# Patient Record
Sex: Male | Born: 1950 | Race: White | Hispanic: No | Marital: Married | Smoking: Never smoker
Health system: Southern US, Community
[De-identification: ages and names within clinical notes are randomized; demographics above are authoritative.]

## PROBLEM LIST (undated history)

## (undated) DIAGNOSIS — R7303 Prediabetes: Secondary | ICD-10-CM

## (undated) DIAGNOSIS — L989 Disorder of the skin and subcutaneous tissue, unspecified: Secondary | ICD-10-CM

## (undated) DIAGNOSIS — T7840XA Allergy, unspecified, initial encounter: Secondary | ICD-10-CM

## (undated) DIAGNOSIS — E559 Vitamin D deficiency, unspecified: Secondary | ICD-10-CM

## (undated) DIAGNOSIS — M1990 Unspecified osteoarthritis, unspecified site: Secondary | ICD-10-CM

## (undated) DIAGNOSIS — E785 Hyperlipidemia, unspecified: Secondary | ICD-10-CM

## (undated) DIAGNOSIS — G4730 Sleep apnea, unspecified: Secondary | ICD-10-CM

## (undated) DIAGNOSIS — Z87442 Personal history of urinary calculi: Secondary | ICD-10-CM

## (undated) DIAGNOSIS — R0602 Shortness of breath: Secondary | ICD-10-CM

## (undated) DIAGNOSIS — N200 Calculus of kidney: Secondary | ICD-10-CM

## (undated) DIAGNOSIS — R5383 Other fatigue: Secondary | ICD-10-CM

## (undated) DIAGNOSIS — R7989 Other specified abnormal findings of blood chemistry: Secondary | ICD-10-CM

## (undated) DIAGNOSIS — I10 Essential (primary) hypertension: Secondary | ICD-10-CM

## (undated) DIAGNOSIS — R4189 Other symptoms and signs involving cognitive functions and awareness: Secondary | ICD-10-CM

## (undated) DIAGNOSIS — E291 Testicular hypofunction: Secondary | ICD-10-CM

## (undated) DIAGNOSIS — R42 Dizziness and giddiness: Secondary | ICD-10-CM

## (undated) DIAGNOSIS — N400 Enlarged prostate without lower urinary tract symptoms: Secondary | ICD-10-CM

## (undated) HISTORY — DX: Dizziness and giddiness: R42

## (undated) HISTORY — DX: Testicular hypofunction: E29.1

## (undated) HISTORY — PX: APPENDECTOMY: SHX54

## (undated) HISTORY — DX: Shortness of breath: R06.02

## (undated) HISTORY — PX: NECK SURGERY: SHX720

## (undated) HISTORY — DX: Vitamin D deficiency, unspecified: E55.9

## (undated) HISTORY — DX: Essential (primary) hypertension: I10

## (undated) HISTORY — DX: Hyperlipidemia, unspecified: E78.5

## (undated) HISTORY — DX: Disorder of the skin and subcutaneous tissue, unspecified: L98.9

## (undated) HISTORY — PX: LITHOTRIPSY: SUR834

## (undated) HISTORY — PX: COLONOSCOPY: SHX174

## (undated) HISTORY — DX: Other specified abnormal findings of blood chemistry: R79.89

## (undated) HISTORY — DX: Benign prostatic hyperplasia without lower urinary tract symptoms: N40.0

## (undated) HISTORY — DX: Other symptoms and signs involving cognitive functions and awareness: R41.89

## (undated) HISTORY — DX: Unspecified osteoarthritis, unspecified site: M19.90

## (undated) HISTORY — PX: HERNIA REPAIR: SHX51

## (undated) HISTORY — DX: Calculus of kidney: N20.0

## (undated) HISTORY — DX: Other fatigue: R53.83

## (undated) HISTORY — DX: Allergy, unspecified, initial encounter: T78.40XA

## (undated) HISTORY — PX: EYE SURGERY: SHX253

---

## 2007-10-01 ENCOUNTER — Encounter: Admission: RE | Admit: 2007-10-01 | Discharge: 2007-10-01 | Payer: Self-pay | Admitting: Neurosurgery

## 2007-10-17 ENCOUNTER — Ambulatory Visit (HOSPITAL_COMMUNITY): Admission: RE | Admit: 2007-10-17 | Discharge: 2007-10-17 | Payer: Self-pay | Admitting: Neurosurgery

## 2009-03-30 IMAGING — RF DG CERVICAL SPINE 1V
1 series · 1 of 1 positions shown · non-contrast
Comparison: none

CLINICAL DATA: C6-7 ACDF. 
 CERVICAL SPINE ?1 VIEW:

[Series 1: run · 1 of 1 slices shown]
[im 1/1]
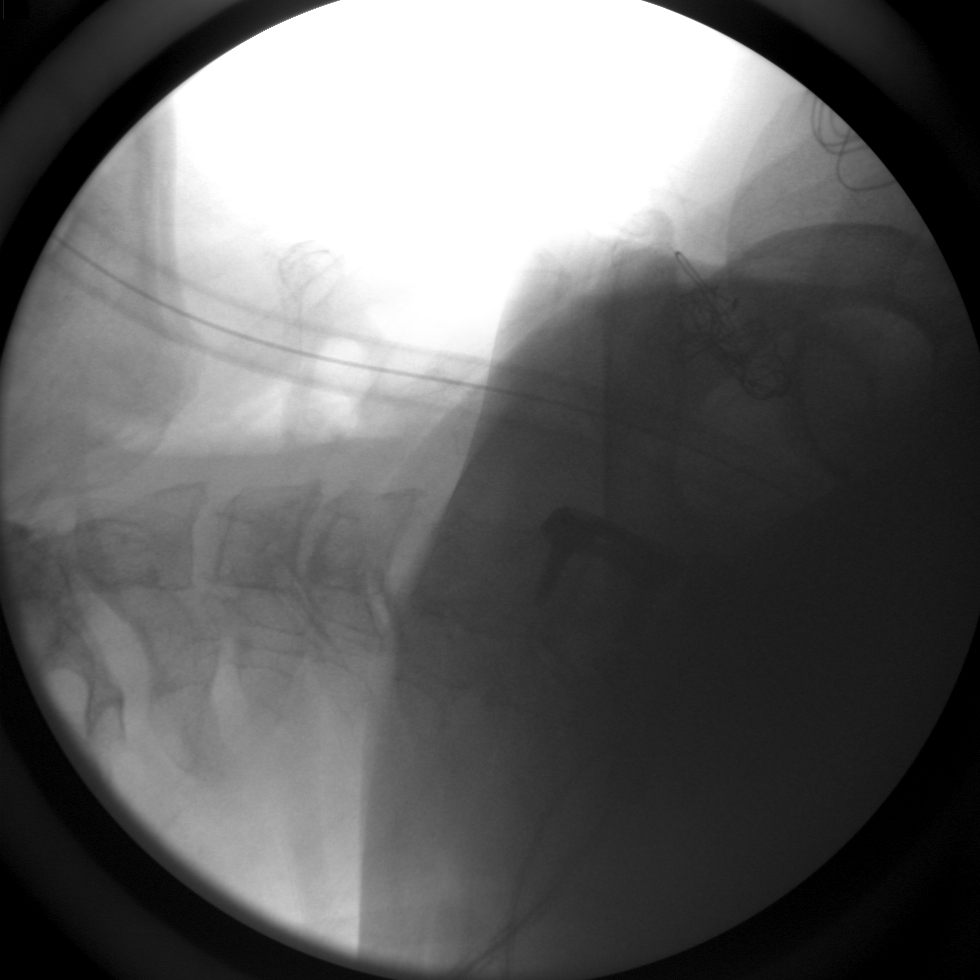

[1 of 1 positions shown; findings below may reference images not displayed]

FINDINGS: A lateral view with the C-arm was obtained.  Based on the prior MRI, there is a prior interbody bony fusion at C5-6.  There is now an anterior plate at C6-7.  Anatomical detail is limited due to overlying shoulders.
IMPRESSION: Anterior plate fusion of C6-7.

## 2010-09-10 ENCOUNTER — Encounter: Payer: Self-pay | Admitting: Neurosurgery

## 2011-01-02 NOTE — Op Note (Signed)
NAME:  [name], [name] NO.:  [id number]   MEDICAL RECORD NO.:  [id number]          PATIENT TYPE:  OIB   LOCATION:  3599                         FACILITY:  [facility]   PHYSICIAN:  Reinaldo Meeker, M.D. DATE OF BIRTH:  1950/12/25   DATE OF PROCEDURE:  10/17/2007  DATE OF DISCHARGE:                               OPERATIVE REPORT   PREOPERATIVE DIAGNOSIS:  Herniated disc C6-C7, diffusely.   POSTOPERATIVE DIAGNOSIS:  Herniated disc C6-C7, diffusely.   PROCEDURE:  C6-C7 anterior cervical discectomy with bone bank fusion  followed by Nuvasive anterior cervical plate.   SURGEON:  Reinaldo Meeker, M.D.   ASSISTANT:  Donalee Citrin, M.D.   PROCEDURE IN DETAIL:  After being placed in the supine position and 5  pounds of halter traction, the patient's left side of the neck was  prepped and draped in the usual sterile fashion.  A localizing x-ray was  taken prior to the incision to identify the appropriate level.  A  transverse incision was made in the left anterior neck starting at the  midline and heading towards the medial aspect of the sternocleidomastoid  muscle.  The platysmal muscle was incised transversely.  The natural  fascial plane between the strap muscles medially and the  sternocleidomastoid muscle laterally, was identified and followed down  to the anterior aspect of the cervical spine.  The longus colli muscles  were identified, split in the midline with unipolar coagulation and  Kitner dissection.  A self-retaining retractor was placed for exposure  and an x-ray showed we approached the appropriate level.  The annulus of  the disc at C6-C7 was incised.  Using pituitaries, rongeurs, and  curettes, the disc material was removed.  A high speed drill was used to  widen the interspace.  The microscope was draped, brought into the  field, and used the remainder of the case.  Using microdissection  technique, the remaining disc material down to the posterior  longitudinal ligament was removed.  The ligament was hypertrophic and  dissected free and then incised transversely.  The cut edge was removed  with a Kerrison punch until the underlying spinal dura and proximal  nerve roots bilaterally were well decompressed.  At this point,  inspection was carried out in all directions for any evidence of  residual compression and none could be identified.  Large amounts of  irrigation were carried out.  Any bleeding was controlled with bipolar  coagulation and Gelfoam.  An 8 mm bony spacer was reconstituted and  impacted without difficulty.  Fluoroscopy showed it to be in good  position.  An appropriate length Helix anterior cervical plate was then  chosen.  Under fluoroscopic guidance, drill holes were placed, followed  by placement of 13 mm screws x 4 until the locking mechanism was  engaged.  Final fluoroscopy showed the plate, plug, and screws to all be  in good position.  Large amounts of irrigation was carried out and any  bleeding controlled with bipolar coagulation.  The wound was then  closed using interrupted Vicryl on the platysma muscle, inverted 5-0 PDS  on the subcuticular layer, and Steri-Strips on the skin.  A sterile  dressing and soft collar were applied and the patient was extubated and  taken to the recovery room in stable condition.           ______________________________  Reinaldo Meeker, M.D.     ROK/MEDQ  D:  10/17/2007  T:  10/18/2007  Job:  [id number]

## 2011-05-11 LAB — BASIC METABOLIC PANEL
BUN: 8
Chloride: 105
GFR calc non Af Amer: >60
Potassium: 4.1
Sodium: 138

## 2011-05-11 LAB — CBC
HCT: 45.1
Hemoglobin: 15.3
MCV: 94.3
Platelets: 194
WBC: 6.4

## 2019-02-24 DIAGNOSIS — Z6827 Body mass index (BMI) 27.0-27.9, adult: Secondary | ICD-10-CM | POA: Insufficient documentation

## 2019-12-17 DIAGNOSIS — M79672 Pain in left foot: Secondary | ICD-10-CM | POA: Insufficient documentation

## 2019-12-17 DIAGNOSIS — M79671 Pain in right foot: Secondary | ICD-10-CM | POA: Insufficient documentation

## 2020-04-14 DIAGNOSIS — M7751 Other enthesopathy of right foot: Secondary | ICD-10-CM

## 2020-04-14 HISTORY — DX: Other enthesopathy of right foot and ankle: M77.51

## 2020-04-15 DIAGNOSIS — M6701 Short Achilles tendon (acquired), right ankle: Secondary | ICD-10-CM | POA: Insufficient documentation

## 2020-04-15 DIAGNOSIS — M6702 Short Achilles tendon (acquired), left ankle: Secondary | ICD-10-CM | POA: Insufficient documentation

## 2020-04-15 HISTORY — DX: Short Achilles tendon (acquired), right ankle: M67.01

## 2020-07-08 DIAGNOSIS — I10 Essential (primary) hypertension: Secondary | ICD-10-CM

## 2020-08-22 DIAGNOSIS — T7840XA Allergy, unspecified, initial encounter: Secondary | ICD-10-CM | POA: Insufficient documentation

## 2020-08-22 DIAGNOSIS — I10 Essential (primary) hypertension: Secondary | ICD-10-CM | POA: Insufficient documentation

## 2020-08-22 DIAGNOSIS — R7989 Other specified abnormal findings of blood chemistry: Secondary | ICD-10-CM

## 2020-08-22 DIAGNOSIS — E559 Vitamin D deficiency, unspecified: Secondary | ICD-10-CM | POA: Insufficient documentation

## 2020-08-23 ENCOUNTER — Encounter: Payer: Self-pay | Admitting: Cardiology

## 2020-08-23 ENCOUNTER — Other Ambulatory Visit: Payer: Self-pay

## 2020-08-23 ENCOUNTER — Ambulatory Visit (INDEPENDENT_AMBULATORY_CARE_PROVIDER_SITE_OTHER): Payer: 59

## 2020-08-23 ENCOUNTER — Ambulatory Visit (INDEPENDENT_AMBULATORY_CARE_PROVIDER_SITE_OTHER): Payer: 59 | Admitting: Cardiology

## 2020-08-23 VITALS — BP 142/86 | HR 75 | Ht 65.0 in | Wt 158.2 lb

## 2020-08-23 DIAGNOSIS — I200 Unstable angina: Secondary | ICD-10-CM

## 2020-08-23 DIAGNOSIS — I8393 Asymptomatic varicose veins of bilateral lower extremities: Secondary | ICD-10-CM

## 2020-08-23 DIAGNOSIS — R079 Chest pain, unspecified: Secondary | ICD-10-CM | POA: Insufficient documentation

## 2020-08-23 DIAGNOSIS — R42 Dizziness and giddiness: Secondary | ICD-10-CM

## 2020-08-23 DIAGNOSIS — I10 Essential (primary) hypertension: Secondary | ICD-10-CM | POA: Diagnosis not present

## 2020-08-23 HISTORY — DX: Asymptomatic varicose veins of bilateral lower extremities: I83.93

## 2020-08-23 HISTORY — DX: Unstable angina: I20.0

## 2020-08-23 HISTORY — DX: Chest pain, unspecified: R07.9

## 2020-08-23 LAB — LIPID PANEL
Chol/HDL Ratio: 5.8 ratio — ABNORMAL HIGH (ref 0.0–5.0)
Cholesterol, Total: 198 mg/dL (ref 100–199)
HDL: 34 mg/dL — ABNORMAL LOW (ref >39)
LDL Chol Calc (NIH): 130 mg/dL — ABNORMAL HIGH (ref 0–99)
Triglycerides: 192 mg/dL — ABNORMAL HIGH (ref 0–149)
VLDL Cholesterol Cal: 34 mg/dL (ref 5–40)

## 2020-08-23 LAB — BASIC METABOLIC PANEL
BUN/Creatinine Ratio: 14 (ref 10–24)
BUN: 13 mg/dL (ref 8–27)
CO2: 22 mmol/L (ref 20–29)
Calcium: 9.2 mg/dL (ref 8.6–10.2)
Chloride: 102 mmol/L (ref 96–106)
Creatinine, Ser: 0.95 mg/dL (ref 0.76–1.27)
GFR calc Af Amer: 95 mL/min/{1.73_m2} (ref >59)
GFR calc non Af Amer: 82 mL/min/{1.73_m2} (ref >59)
Glucose: 90 mg/dL (ref 65–99)
Potassium: 4.2 mmol/L (ref 3.5–5.2)
Sodium: 142 mmol/L (ref 134–144)

## 2020-08-23 LAB — TSH: TSH: 3.2 u[IU]/mL (ref 0.450–4.500)

## 2020-08-23 LAB — MAGNESIUM: Magnesium: 1.9 mg/dL (ref 1.6–2.3)

## 2020-08-23 NOTE — Progress Notes (Signed)
Cardiology Office Note:    Date:  08/23/2020   ID:  Dustin Sloan, DOB 1950-09-21, MRN [id number]  PCP:  Imagene Riches, NP  Cardiologist:  Berniece Salines, DO  Electrophysiologist:  None   Referring MD: Imagene Riches, NP    History of Present Illness:    Dustin Sloan is a 70 y.o. male with a hx of hypertension, vitamin D deficiency is here today to be evaluated for dizziness and chest pain.  Patient tells me over the last several years he has had increasing dizziness.  He notes that he never really complains about it he kept it to himself.  He notes he also has had intermittent chest pain.  He described as a chest discomfort left-sided with no radiation.  Nothing makes it better or worse.  He denies any shortness of breath.  But tells me the main problem he is experiencing some dizziness and his intermittent chest pain.  In addition he admits to some leg cramps.  His PCP has previously sent the patient to [facility] hospital to get an echocardiogram which was essentially normal.  Past Medical History:  Diagnosis Date  . Allergies   . Hypertension   . Low testosterone   . Vitamin D deficiency     Past Surgical History:  Procedure Laterality Date  . APPENDECTOMY     2018  . NECK SURGERY     1988, 2008    Current Medications: Current Meds  Medication Sig  . Cholecalciferol 50 MCG (2000 UT) CAPS Take by mouth.  . losartan (COZAAR) 50 MG tablet Take 50 mg by mouth daily.  . meclizine (ANTIVERT) 25 MG tablet Take 25 mg by mouth 3 (three) times daily as needed for dizziness.  . meloxicam (MOBIC) 7.5 MG tablet Take 7.5 mg by mouth daily.  . minocycline (MINOCIN) 100 MG capsule Take 100 mg by mouth daily as needed.  . tadalafil (CIALIS) 5 MG tablet Take 5 mg by mouth daily.  Marland Kitchen triamcinolone (KENALOG) 0.1 % Apply 1 application topically 3 (three) times daily.  . vitamin B-12 (CYANOCOBALAMIN) 500 MCG tablet Take by mouth.     Allergies:   Oxycodone-acetaminophen   Social History    Socioeconomic History  . Marital status: Married    Spouse name: Not on file  . Number of children: Not on file  . Years of education: Not on file  . Highest education level: Not on file  Occupational History  . Not on file  Tobacco Use  . Smoking status: Never Smoker  . Smokeless tobacco: Never Used  Substance and Sexual Activity  . Alcohol use: Never  . Drug use: Never  . Sexual activity: Not on file  Other Topics Concern  . Not on file  Social History Narrative  . Not on file   Social Determinants of Health   Financial Resource Strain: Not on file  Food Insecurity: Not on file  Transportation Needs: Not on file  Physical Activity: Not on file  Stress: Not on file  Social Connections: Not on file     Family History: The patient's family history includes Diabetes in his mother; Kidney Stones in his mother; Kidney disease in his mother.  ROS:   Review of Systems  Constitution: Reports dizziness.  Negative for decreased appetite, fever and weight gain.  HENT: Negative for congestion, ear discharge, hoarse voice and sore throat.   Eyes: Negative for discharge, redness, vision loss in right eye and visual halos.  Cardiovascular: Reports chest  pain..  Negative for negative for dyspnea on exertion, leg swelling, orthopnea and palpitations.  Respiratory: Negative for cough, hemoptysis, shortness of breath and snoring.   Endocrine: Negative for heat intolerance and polyphagia.  Hematologic/Lymphatic: Negative for bleeding problem. Does not bruise/bleed easily.  Skin: Varicose veins noted in bilateral leg.  Negative for flushing, nail changes, rash and suspicious lesions.  Musculoskeletal: Negative for arthritis, joint pain, muscle cramps, myalgias, neck pain and stiffness.  Gastrointestinal: Negative for abdominal pain, bowel incontinence, diarrhea and excessive appetite.  Genitourinary: Negative for decreased libido, genital sores and incomplete emptying.  Neurological:  Negative for brief paralysis, focal weakness, headaches and loss of balance.  Psychiatric/Behavioral: Negative for altered mental status, depression and suicidal ideas.  Allergic/Immunologic: Negative for HIV exposure and persistent infections.    EKGs/Labs/Other Studies Reviewed:    The following studies were reviewed today:   EKG:  The ekg ordered today demonstrates sinus rhythm, heart rate 72 bpm with first-degree AV block no prior EKG for comparison.  Echocardiogram done at [facility] in November 2021 shows normal EF 60 to 65%.  Grade 1 diastolic dysfunction and mild aortic valve sclerosis without stenosis.  Lower extremity ultrasound on July 07, 2020 shows bilateral no evidence of DVT.  CT scan of the head done on August 09, 2020 showed no acute pathology however small vessels white matter disease.   Recent Labs: No results found for requested labs within last 8760 hours.  Recent Lipid Panel No results found for: CHOL, TRIG, HDL, CHOLHDL, VLDL, LDLCALC, LDLDIRECT  Physical Exam:    VS:  BP (!) 142/86 (BP Location: Right Arm)   Pulse 75   Ht 5' 5"  (1.651 m)   Wt 158 lb 3.2 oz (71.8 kg)   SpO2 96%   BMI 26.33 kg/m     Wt Readings from Last 3 Encounters:  08/23/20 158 lb 3.2 oz (71.8 kg)     GEN: Well nourished, well developed in no acute distress HEENT: Normal NECK: No JVD; No carotid bruits LYMPHATICS: No lymphadenopathy CARDIAC: S1S2 noted,RRR, no murmurs, rubs, gallops RESPIRATORY:  Clear to auscultation without rales, wheezing or rhonchi  ABDOMEN: Soft, non-tender, non-distended, +bowel sounds, no guarding. EXTREMITIES: No edema, No cyanosis, no clubbing MUSCULOSKELETAL:  No deformity  SKIN: Warm and dry NEUROLOGIC:  Alert and oriented x 3, non-focal PSYCHIATRIC:  Normal affect, good insight  ASSESSMENT:    1. Chest pain, unspecified type   2. Dizziness   3. Primary hypertension   4. Unstable angina pectoris ([place])   5. Asymptomatic varicose  veins of both lower extremities    PLAN:      I would like to rule out a cardiovascular etiology of this palpitation, therefore at this time I would like to placed a zio patch for 14 days.  I reviewed his echocardiogram from [facility] which was done in November 2021 does not show any evidence of structural abnormalities.  In terms of his chest pain we discussed pursuing with an ischemic evaluation.  Shared decision a pharmacological nuclear stress test will be done at this time.  The patient educate about this test that he is agreeable to proceed.  All of his questions has been answered.  He does take daily Cialis therefore we will hold off on starting the patient on nitroglycerin. For now I do reccomend that the patient goes to the nearest ED if  symptoms recur.  His blood pressure slightly elevated in the office today.  We discussed cutting back on his  salt and hopefully this will help with some improvement.  If not at his next appointment we will have to consider adjusting his antihypertensive medication.  Varicose veins-he has some questions about intervention for this.  We discussed his options for now he prefers to hold off for any further interventions. The patient is in agreement with the above plan. The patient left the office in stable condition.  The patient will follow up in   Medication Adjustments/Labs and Tests Ordered: Current medicines are reviewed at length with the patient today.  Concerns regarding medicines are outlined above.  Orders Placed This Encounter  Procedures  . Magnesium  . TSH  . Lipid panel  . Basic metabolic panel  . Cardiac Stress Test: Informed Consent Details: Physician/Practitioner Attestation; Transcribe to consent form and obtain patient signature  . Cardiac Stress Test: Informed Consent Details: Physician/Practitioner Attestation; Transcribe to consent form and obtain patient signature  . MYOCARDIAL PERFUSION IMAGING  . LONG TERM MONITOR  (3-14 DAYS)  . EKG 12-Lead   No orders of the defined types were placed in this encounter.   Patient Instructions  Medication Instructions:  Your physician recommends that you continue on your current medications as directed. Please refer to the Current Medication list given to you today.  *If you need a refill on your cardiac medications before your next appointment, please call your pharmacy*   Lab Work: TODAY:  LIPID, TSH, MAG, & BMET  If you have labs (blood work) drawn today and your tests are completely normal, you will receive your results only by: Marland Kitchen MyChart Message (if you have MyChart) OR . A paper copy in the mail If you have any lab test that is abnormal or we need to change your treatment, we will call you to review the results.   Testing/Procedures: Your physician has requested that you have a lexiscan myoview. For further information please visit [email or web address]. Please follow instruction sheet, as BELOW    You are scheduled for a Myocardial Perfusion Imaging Study.  Please arrive 15 minutes prior to your appointment time for registration and insurance purposes.  The test will take approximately 3 to 4 hours to complete; you may bring reading material.  If someone comes with you to your appointment, they will need to remain in the main lobby due to limited space in the testing area. **If you are pregnant or breastfeeding, please notify the nuclear lab prior to your appointment**  How to prepare for your Myocardial Perfusion Test: . Do not eat or drink 3 hours prior to your test, except you may have water. . Do not consume products containing caffeine (regular or decaffeinated) 12 hours prior to your test. (ex: coffee, chocolate, sodas, tea). . Do bring a list of your current medications with you.  If not listed below, you may take your medications as normal. . Do wear comfortable clothes (no dresses or overalls) and walking shoes, tennis shoes preferred (No heels  or open toe shoes are allowed). . Do NOT wear cologne, perfume, aftershave, or lotions (deodorant is allowed). . If these instructions are not followed, your test will have to be rescheduled.   Marland Kitchen   ZIO AT Long term monitor-Live Telemetry  Your physician has requested you wear a ZIO patch monitor for __ days.  This is a single patch monitor. [employer] supplies one patch monitor per enrollment. Additional stickers are not available.  Please do not apply patch if you will be having a Nuclear Stress Test, Echocardiogram, Cardiac CT,  MRI, or Chest Xray during the time frame you would be wearing the monitor. The patch cannot be worn during these tests. You cannot remove and re-apply the ZIO AT patch monitor.   Your ZIO patch monitor will be sent Fed Ex from [employer] directly to your home address. The monitor may also be mailed to a PO BOX if home delivery is not available. It may take 3-5 days to receive your monitor after you have been enrolled.  Once you have received you monitor, please review enclosed instructions. Your monitor has already been registered assigning a specific monitor serial # to you.   Applying the monitor  Shave hair from upper left chest.  Hold abrader disc by orange tab. Rub abrader in 40 strokes over left upper chest as indicated in your monitor instructions.  Clean area with 4 enclosed alcohol pads. Use all pads to ensure the area is cleaned thoroughly. Let dry.  Apply patch as indicated in monitor instructions. Patch will be placed under collarbone on left side of chest with arrow pointing upward.  Rub patch adhesive wings for 2 minutes. Remove the white label marked "1". Remove the white label marked "2". Rub patch adhesive wings for 2 additional minutes.  While looking in a mirror, press and release button in center of patch. A small green light will flash 3-4 times. This will be your only indicator the monitor has been turned on.  Do not shower for the first  24 hours. You may shower after the first 24 hours.  Press the button if you feel a symptom. You will hear a small click. Record Date, Time and Symptom in the Patient Log.   Starting the Gateway  In your kit there is a Hydrographic surveyor box the size of a cellphone. This is Airline pilot. It transmits all your recorded data to [facility]. This box must stay within 10 feet of you at all times. Open the box and push the * button. There will be a light that blinks orange and then green a few times. When the light stops blinking, the Gateway is connected to the ZIO patch.  Call [employer] at [phone] to confirm your monitor is transmitting.   Returning your monitor  Remove your patch and place it inside the [place]. In the lower half of the Gateway there is a white bag with prepaid postage on it. Place Gateway in bag and seal. Mail package back to [place] as soon as possible. Your physician should have your final report approximately 7 days after you have mailed back your monitor.   Call [place] at [phone] if you have questions regarding your ZIO AT patch monitor. Call them immediately if you see an orange light blinking on your monitor.  If your monitor falls off in less than 4 days contact our Monitor department at [phone]. If your monitor becomes loose or falls off after 4 days call [employer] at [phone] for suggestions on securing your monitor.      Follow-Up: At [facility], you and your health needs are our priority.  As part of our continuing mission to provide you with exceptional heart care, we have created designated Provider Care Teams.  These Care Teams include your primary Cardiologist (physician) and Advanced Practice Providers (APPs -  Physician Assistants and Nurse Practitioners) who all work together to provide you with the care you need, when you need it.  We recommend signing up for the patient portal called "MyChart".  Sign up  information is  provided on this After Visit Summary.  MyChart is used to connect with patients for Virtual Visits (Telemedicine).  Patients are able to view lab/test results, encounter notes, upcoming appointments, etc.  Non-urgent messages can be sent to your provider as well.   To learn more about what you can do with MyChart, go to [email or web address].    Your next appointment:   3 month(s)  The format for your next appointment:   In Person  Provider:   Berniece Salines, DO   Other Instructions  Cardiac Nuclear Scan A cardiac nuclear scan is a test that is done to check the flow of blood to your heart. It is done when you are resting and when you are exercising. The test looks for problems such as:  Not enough blood reaching a portion of the heart.  The heart muscle not working as it should. You may need this test if:  You have heart disease.  You have had lab results that are not normal.  You have had heart surgery or a balloon procedure to open up blocked arteries (angioplasty).  You have chest pain.  You have shortness of breath. In this test, a special dye (tracer) is put into your bloodstream. The tracer will travel to your heart. A camera will then take pictures of your heart to see how the tracer moves through your heart. This test is usually done at a hospital and takes 2-4 hours. Tell a doctor about:  Any allergies you have.  All medicines you are taking, including vitamins, herbs, eye drops, creams, and over-the-counter medicines.  Any problems you or family members have had with anesthetic medicines.  Any blood disorders you have.  Any surgeries you have had.  Any medical conditions you have.  Whether you are pregnant or may be pregnant. What are the risks? Generally, this is a safe test. However, problems may occur, such as:  Serious chest pain and heart attack. This is only a risk if the stress portion of the test is done.  Rapid heartbeat.  A feeling of warmth  in your chest. This feeling usually does not last long.  Allergic reaction to the tracer. What happens before the test?  Ask your doctor about changing or stopping your normal medicines. This is important.  Follow instructions from your doctor about what you cannot eat or drink.  Remove your jewelry on the day of the test. What happens during the test?  An IV tube will be inserted into one of your veins.  Your doctor will give you a small amount of tracer through the IV tube.  You will wait for 20-40 minutes while the tracer moves through your bloodstream.  Your heart will be monitored with an electrocardiogram (ECG).  You will lie down on an exam table.  Pictures of your heart will be taken for about 15-20 minutes.  You may also have a stress test. For this test, one of these things may be done: ? You will be asked to exercise on a treadmill or a stationary bike. ? You will be given medicines that will make your heart work harder. This is done if you are unable to exercise.  When blood flow to your heart has peaked, a tracer will again be given through the IV tube.  After 20-40 minutes, you will get back on the exam table. More pictures will be taken of your heart.  Depending on the tracer that is used, more pictures may need to  be taken 3-4 hours later.  Your IV tube will be removed when the test is over. The test may vary among doctors and hospitals. What happens after the test?  Ask your doctor: ? Whether you can return to your normal schedule, including diet, activities, and medicines. ? Whether you should drink more fluids. This will help to remove the tracer from your body. Drink enough fluid to keep your pee (urine) pale yellow.  Ask your doctor, or the department that is doing the test: ? When will my results be ready? ? How will I get my results? Summary  A cardiac nuclear scan is a test that is done to check the flow of blood to your heart.  Tell your  doctor whether you are pregnant or may be pregnant.  Before the test, ask your doctor about changing or stopping your normal medicines. This is important.  Ask your doctor whether you can return to your normal activities. You may be asked to drink more fluids. This information is not intended to replace advice given to you by your health care provider. Make sure you discuss any questions you have with your health care provider. Document Revised: 11/26/2018 Document Reviewed: 01/20/2018 Elsevier Patient Education  [place].      Adopting a Healthy Lifestyle.  Know what a healthy weight is for you (roughly BMI <25) and aim to maintain this   Aim for 7+ servings of fruits and vegetables daily   65-80+ fluid ounces of water or unsweet tea for healthy kidneys   Limit to max 1 drink of alcohol per day; avoid smoking/tobacco   Limit animal fats in diet for cholesterol and heart health - choose grass fed whenever available   Avoid highly processed foods, and foods high in saturated/trans fats   Aim for low stress - take time to unwind and care for your mental health   Aim for 150 min of moderate intensity exercise weekly for heart health, and weights twice weekly for bone health   Aim for 7-9 hours of sleep daily   When it comes to diets, agreement about the perfect plan isnt easy to find, even among the experts. Experts at the [employer] developed an idea known as the Healthy Eating Plate. Just imagine a plate divided into logical, healthy portions.   The emphasis is on diet quality:   Load up on vegetables and fruits - one-half of your plate: Aim for color and variety, and remember that potatoes dont count.   Go for whole grains - one-quarter of your plate: Whole wheat, barley, wheat berries, quinoa, oats, brown rice, and foods made with them. If you want pasta, go with whole wheat pasta.   Protein power - one-quarter of your plate: Fish, chicken,  beans, and nuts are all healthy, versatile protein sources. Limit red meat.   The diet, however, does go beyond the plate, offering a few other suggestions.   Use healthy plant oils, such as olive, canola, soy, corn, sunflower and peanut. Check the labels, and avoid partially hydrogenated oil, which have unhealthy trans fats.   If youre thirsty, drink water. Coffee and tea are good in moderation, but skip sugary drinks and limit milk and dairy products to one or two daily servings.   The type of carbohydrate in the diet is more important than the amount. Some sources of carbohydrates, such as vegetables, fruits, whole grains, and beans-are healthier than others.   Finally, stay active  Signed,  Berniece Salines, DO  08/23/2020 10:36 AM    [facility]

## 2020-08-23 NOTE — Patient Instructions (Addendum)
Medication Instructions:  Your physician recommends that you continue on your current medications as directed. Please refer to the Current Medication list given to you today.  *If you need a refill on your cardiac medications before your next appointment, please call your pharmacy*   Lab Work: TODAY:  LIPID, TSH, MAG, & BMET  If you have labs (blood work) drawn today and your tests are completely normal, you will receive your results only by: Marland Kitchen MyChart Message (if you have MyChart) OR . A paper copy in the mail If you have any lab test that is abnormal or we need to change your treatment, we will call you to review the results.   Testing/Procedures: Your physician has requested that you have a lexiscan myoview. For further information please visit [email or web address]. Please follow instruction sheet, as BELOW    You are scheduled for a Myocardial Perfusion Imaging Study.  Please arrive 15 minutes prior to your appointment time for registration and insurance purposes.  The test will take approximately 3 to 4 hours to complete; you may bring reading material.  If someone comes with you to your appointment, they will need to remain in the main lobby due to limited space in the testing area. **If you are pregnant or breastfeeding, please notify the nuclear lab prior to your appointment**  How to prepare for your Myocardial Perfusion Test: . Do not eat or drink 3 hours prior to your test, except you may have water. . Do not consume products containing caffeine (regular or decaffeinated) 12 hours prior to your test. (ex: coffee, chocolate, sodas, tea). . Do bring a list of your current medications with you.  If not listed below, you may take your medications as normal. . Do wear comfortable clothes (no dresses or overalls) and walking shoes, tennis shoes preferred (No heels or open toe shoes are allowed). . Do NOT wear cologne, perfume, aftershave, or lotions (deodorant is allowed). . If these  instructions are not followed, your test will have to be rescheduled.   Marland Kitchen   ZIO AT Long term monitor-Live Telemetry  Your physician has requested you wear a ZIO patch monitor for __ days.  This is a single patch monitor. [employer] supplies one patch monitor per enrollment. Additional stickers are not available.  Please do not apply patch if you will be having a Nuclear Stress Test, Echocardiogram, Cardiac CT, MRI, or Chest Xray during the time frame you would be wearing the monitor. The patch cannot be worn during these tests. You cannot remove and re-apply the ZIO AT patch monitor.   Your ZIO patch monitor will be sent Fed Ex from [employer] directly to your home address. The monitor may also be mailed to a PO BOX if home delivery is not available. It may take 3-5 days to receive your monitor after you have been enrolled.  Once you have received you monitor, please review enclosed instructions. Your monitor has already been registered assigning a specific monitor serial # to you.   Applying the monitor  Shave hair from upper left chest.  Hold abrader disc by orange tab. Rub abrader in 40 strokes over left upper chest as indicated in your monitor instructions.  Clean area with 4 enclosed alcohol pads. Use all pads to ensure the area is cleaned thoroughly. Let dry.  Apply patch as indicated in monitor instructions. Patch will be placed under collarbone on left side of chest with arrow pointing upward.  Rub patch adhesive wings for 2 minutes. Remove  the white label marked "1". Remove the white label marked "2". Rub patch adhesive wings for 2 additional minutes.  While looking in a mirror, press and release button in center of patch. A small green light will flash 3-4 times. This will be your only indicator the monitor has been turned on.  Do not shower for the first 24 hours. You may shower after the first 24 hours.  Press the button if you feel a symptom. You will hear a small click.  Record Date, Time and Symptom in the Patient Log.   Starting the Gateway  In your kit there is a Hydrographic surveyor box the size of a cellphone. This is Airline pilot. It transmits all your recorded data to [facility]. This box must stay within 10 feet of you at all times. Open the box and push the * button. There will be a light that blinks orange and then green a few times. When the light stops blinking, the Gateway is connected to the ZIO patch.  Call [employer] at [phone] to confirm your monitor is transmitting.   Returning your monitor  Remove your patch and place it inside the [employer]. In the lower half of the Gateway there is a white bag with prepaid postage on it. Place Gateway in bag and seal. Mail package back to [place] as soon as possible. Your physician should have your final report approximately 7 days after you have mailed back your monitor.   Call [place] at [phone] if you have questions regarding your ZIO AT patch monitor. Call them immediately if you see an orange light blinking on your monitor.  If your monitor falls off in less than 4 days contact our Monitor department at [phone]. If your monitor becomes loose or falls off after 4 days call [employer] at [phone] for suggestions on securing your monitor.      Follow-Up: At [facility], you and your health needs are our priority.  As part of our continuing mission to provide you with exceptional heart care, we have created designated Provider Care Teams.  These Care Teams include your primary Cardiologist (physician) and Advanced Practice Providers (APPs -  Physician Assistants and Nurse Practitioners) who all work together to provide you with the care you need, when you need it.  We recommend signing up for the patient portal called "MyChart".  Sign up information is provided on this After Visit Summary.  MyChart is used to connect with patients for Virtual Visits (Telemedicine).  Patients  are able to view lab/test results, encounter notes, upcoming appointments, etc.  Non-urgent messages can be sent to your provider as well.   To learn more about what you can do with MyChart, go to [email or web address].    Your next appointment:   3 month(s)  The format for your next appointment:   In Person  Provider:   Berniece Salines, DO   Other Instructions  Cardiac Nuclear Scan A cardiac nuclear scan is a test that is done to check the flow of blood to your heart. It is done when you are resting and when you are exercising. The test looks for problems such as:  Not enough blood reaching a portion of the heart.  The heart muscle not working as it should. You may need this test if:  You have heart disease.  You have had lab results that are not normal.  You have had heart surgery or a balloon procedure to open up blocked arteries (angioplasty).  You  have chest pain.  You have shortness of breath. In this test, a special dye (tracer) is put into your bloodstream. The tracer will travel to your heart. A camera will then take pictures of your heart to see how the tracer moves through your heart. This test is usually done at a hospital and takes 2-4 hours. Tell a doctor about:  Any allergies you have.  All medicines you are taking, including vitamins, herbs, eye drops, creams, and over-the-counter medicines.  Any problems you or family members have had with anesthetic medicines.  Any blood disorders you have.  Any surgeries you have had.  Any medical conditions you have.  Whether you are pregnant or may be pregnant. What are the risks? Generally, this is a safe test. However, problems may occur, such as:  Serious chest pain and heart attack. This is only a risk if the stress portion of the test is done.  Rapid heartbeat.  A feeling of warmth in your chest. This feeling usually does not last long.  Allergic reaction to the tracer. What happens before the  test?  Ask your doctor about changing or stopping your normal medicines. This is important.  Follow instructions from your doctor about what you cannot eat or drink.  Remove your jewelry on the day of the test. What happens during the test?  An IV tube will be inserted into one of your veins.  Your doctor will give you a small amount of tracer through the IV tube.  You will wait for 20-40 minutes while the tracer moves through your bloodstream.  Your heart will be monitored with an electrocardiogram (ECG).  You will lie down on an exam table.  Pictures of your heart will be taken for about 15-20 minutes.  You may also have a stress test. For this test, one of these things may be done: ? You will be asked to exercise on a treadmill or a stationary bike. ? You will be given medicines that will make your heart work harder. This is done if you are unable to exercise.  When blood flow to your heart has peaked, a tracer will again be given through the IV tube.  After 20-40 minutes, you will get back on the exam table. More pictures will be taken of your heart.  Depending on the tracer that is used, more pictures may need to be taken 3-4 hours later.  Your IV tube will be removed when the test is over. The test may vary among doctors and hospitals. What happens after the test?  Ask your doctor: ? Whether you can return to your normal schedule, including diet, activities, and medicines. ? Whether you should drink more fluids. This will help to remove the tracer from your body. Drink enough fluid to keep your pee (urine) pale yellow.  Ask your doctor, or the department that is doing the test: ? When will my results be ready? ? How will I get my results? Summary  A cardiac nuclear scan is a test that is done to check the flow of blood to your heart.  Tell your doctor whether you are pregnant or may be pregnant.  Before the test, ask your doctor about changing or stopping your  normal medicines. This is important.  Ask your doctor whether you can return to your normal activities. You may be asked to drink more fluids. This information is not intended to replace advice given to you by your health care provider. Make sure you discuss  any questions you have with your health care provider. Document Revised: 11/26/2018 Document Reviewed: 01/20/2018 Elsevier Patient Education  [place].

## 2020-08-24 ENCOUNTER — Telehealth: Payer: Self-pay | Admitting: *Deleted

## 2020-08-24 NOTE — Telephone Encounter (Signed)
Patient returning call.

## 2020-08-24 NOTE — Telephone Encounter (Signed)
Left message on voicemail in reference to upcoming appointment scheduled for 08/31/20. Phone number given for a call back so details instructions can be given. No mychart available.Lennan Malone, [name]

## 2020-08-24 NOTE — Telephone Encounter (Signed)
Patient given detailed instructions per Myocardial Perfusion Study Information Sheet for the test on 08/31/20 at 0815. Patient notified to arrive 15 minutes early and that it is imperative to arrive on time for appointment to keep from having the test rescheduled.  If you need to cancel or reschedule your appointment, please call the office within 24 hours of your appointment. . Patient verbalized understanding.Dustin Sloan, [name]'

## 2020-08-25 ENCOUNTER — Telehealth: Payer: Self-pay

## 2020-08-25 MED ORDER — ATORVASTATIN CALCIUM 20 MG PO TABS: 20.0000 mg | ORAL_TABLET | Freq: Every day | ORAL | 3 refills | Status: DC

## 2020-08-25 NOTE — Telephone Encounter (Signed)
-----   Message from Thomasene Ripple, DO sent at 08/23/2020  5:03 PM EST ----- His LDL is 130 and his TG is 192, HDL is 32. I would like him to start lipitor 20 mg daily.

## 2020-08-25 NOTE — Telephone Encounter (Signed)
Spoke with patient regarding results and recommendation.  Patient verbalizes understanding and is agreeable to plan of care. Advised patient to call back with any issues or concerns.  

## 2020-08-25 NOTE — Telephone Encounter (Signed)
-----   Message from Kardie Tobb, DO sent at 08/23/2020  5:03 PM EST ----- His LDL is 130 and his TG is 192, HDL is 32. I would like Dustin Sloan to start lipitor 20 mg daily.  

## 2020-08-25 NOTE — Telephone Encounter (Signed)
Left message on patients voicemail to please return our call.   

## 2020-08-25 NOTE — Telephone Encounter (Signed)
Patient called back returning Morgan's Call

## 2020-09-01 ENCOUNTER — Encounter (HOSPITAL_COMMUNITY): Payer: Self-pay | Admitting: *Deleted

## 2020-09-01 ENCOUNTER — Telehealth (HOSPITAL_COMMUNITY): Payer: Self-pay | Admitting: *Deleted

## 2020-09-01 NOTE — Telephone Encounter (Signed)
Patient given detailed instructions per Myocardial Perfusion Study Information Sheet for the test on 09/08/20 at 8:00. Patient notified to arrive 15 minutes early and that it is imperative to arrive on time for appointment to keep from having the test rescheduled.  If you need to cancel or reschedule your appointment, please call the office within 24 hours of your appointment. . Patient verbalized understanding.Daneil Dolin

## 2020-09-01 NOTE — Telephone Encounter (Signed)
Left message on voicemail in reference to upcoming appointment scheduled for 09/08/20. Phone number given for a call back so details instructions can be given. Dustin Sloan

## 2020-09-06 DIAGNOSIS — R42 Dizziness and giddiness: Secondary | ICD-10-CM | POA: Diagnosis not present

## 2020-09-08 ENCOUNTER — Ambulatory Visit (INDEPENDENT_AMBULATORY_CARE_PROVIDER_SITE_OTHER): Payer: 59

## 2020-09-08 ENCOUNTER — Other Ambulatory Visit: Payer: Self-pay

## 2020-09-08 DIAGNOSIS — R079 Chest pain, unspecified: Secondary | ICD-10-CM

## 2020-09-08 LAB — MYOCARDIAL PERFUSION IMAGING
LV dias vol: 97 mL (ref 62–150)
LV sys vol: 39 mL
Peak HR: 88 {beats}/min
Rest HR: 67 {beats}/min
SDS: 1
SRS: 2
SSS: 3
TID: 1.01

## 2020-09-08 MED ORDER — TECHNETIUM TC 99M TETROFOSMIN IV KIT
10.2000 | PACK | Freq: Once | INTRAVENOUS | Status: AC | PRN
  Administered 2020-09-08: 10.2 via INTRAVENOUS

## 2020-09-08 MED ORDER — TECHNETIUM TC 99M TETROFOSMIN IV KIT
32.2000 | PACK | Freq: Once | INTRAVENOUS | Status: AC | PRN
  Administered 2020-09-08: 32.2 via INTRAVENOUS

## 2020-09-08 MED ORDER — REGADENOSON 0.4 MG/5ML IV SOLN
0.4000 mg | Freq: Once | INTRAVENOUS | Status: AC
  Administered 2020-09-08: 0.4 mg via INTRAVENOUS

## 2020-09-08 MED ORDER — AMINOPHYLLINE 25 MG/ML IV SOLN
75.0000 mg | Freq: Once | INTRAVENOUS | Status: AC
  Administered 2020-09-08: 75 mg via INTRAVENOUS

## 2020-09-12 ENCOUNTER — Telehealth: Payer: Self-pay

## 2020-09-12 NOTE — Telephone Encounter (Signed)
Tried calling patient. No answer and no voicemail set up for me to leave a message.  I will mail the patient a letter at this time.  

## 2020-09-12 NOTE — Telephone Encounter (Signed)
Pt is returning call.  

## 2020-09-12 NOTE — Telephone Encounter (Signed)
Patient informed of results.  

## 2020-09-12 NOTE — Telephone Encounter (Signed)
-----   Message from Thomasene Ripple, DO sent at 09/08/2020 12:53 PM EST ----- Randie Heinz news, stress test normal

## 2020-11-22 ENCOUNTER — Ambulatory Visit: Payer: Medicare Other | Admitting: Cardiology

## 2020-12-14 ENCOUNTER — Other Ambulatory Visit: Payer: Self-pay

## 2020-12-14 ENCOUNTER — Ambulatory Visit (INDEPENDENT_AMBULATORY_CARE_PROVIDER_SITE_OTHER): Payer: 59 | Admitting: Cardiology

## 2020-12-14 ENCOUNTER — Encounter: Payer: Self-pay | Admitting: Cardiology

## 2020-12-14 VITALS — BP 130/84 | HR 63 | Ht 65.0 in | Wt 157.6 lb

## 2020-12-14 DIAGNOSIS — R0789 Other chest pain: Secondary | ICD-10-CM

## 2020-12-14 DIAGNOSIS — I10 Essential (primary) hypertension: Secondary | ICD-10-CM | POA: Diagnosis not present

## 2020-12-14 DIAGNOSIS — E559 Vitamin D deficiency, unspecified: Secondary | ICD-10-CM | POA: Diagnosis not present

## 2020-12-14 NOTE — Patient Instructions (Signed)

## 2020-12-14 NOTE — Progress Notes (Signed)
Cardiology Office Note:    Date:  12/14/2020   ID:  Dustin Sloan, DOB 09-06-1950, MRN [id number]  PCP:  Dema Severin, NP  Cardiologist:  Thomasene Ripple, DO  Electrophysiologist:  None   Referring MD: Dema Severin, NP   I am doing well  History of Present Illness:    Dustin Sloan is a 70 y.o. male with a hx of hypertension, vitamin D deficiency here today for follow-up visit.  I first saw the patient in January 2022 at that time he was experiencing chest pain as well as dizziness.  During that visit I reviewed his echocardiogram which was done at [facility].  Placed a monitor on the patient as well as requested a nuclear stress test.  He was able to get his testing done and this has been normal.  Here for no complaints today.  Past Medical History:  Diagnosis Date  . Allergies   . Hypertension   . Low testosterone   . Vitamin D deficiency     Past Surgical History:  Procedure Laterality Date  . APPENDECTOMY     2018  . NECK SURGERY     1988, 2008    Current Medications: Current Meds  Medication Sig  . atorvastatin (LIPITOR) 20 MG tablet Take 1 tablet (20 mg total) by mouth daily.  . Cholecalciferol 50 MCG (2000 UT) CAPS Take by mouth.  . losartan (COZAAR) 50 MG tablet Take 50 mg by mouth daily.  . meclizine (ANTIVERT) 25 MG tablet Take 25 mg by mouth 3 (three) times daily as needed for dizziness.  . meloxicam (MOBIC) 7.5 MG tablet Take 7.5 mg by mouth daily.  . minocycline (MINOCIN) 100 MG capsule Take 100 mg by mouth daily as needed.  . tadalafil (CIALIS) 5 MG tablet Take 5 mg by mouth daily.  Marland Kitchen triamcinolone (KENALOG) 0.1 % Apply 1 application topically 3 (three) times daily.  . vitamin B-12 (CYANOCOBALAMIN) 500 MCG tablet Take by mouth.     Allergies:   Oxycodone-acetaminophen   Social History   Socioeconomic History  . Marital status: Married    Spouse name: Not on file  . Number of children: Not on file  . Years of education: Not on file  .  Highest education level: Not on file  Occupational History  . Not on file  Tobacco Use  . Smoking status: Never Smoker  . Smokeless tobacco: Never Used  Substance and Sexual Activity  . Alcohol use: Never  . Drug use: Never  . Sexual activity: Not on file  Other Topics Concern  . Not on file  Social History Narrative  . Not on file   Social Determinants of Health   Financial Resource Strain: Not on file  Food Insecurity: Not on file  Transportation Needs: Not on file  Physical Activity: Not on file  Stress: Not on file  Social Connections: Not on file     Family History: The patient's family history includes Diabetes in his mother; Kidney Stones in his mother; Kidney disease in his mother.  ROS:   Review of Systems  Constitution: Negative for decreased appetite, fever and weight gain.  HENT: Negative for congestion, ear discharge, hoarse voice and sore throat.   Eyes: Negative for discharge, redness, vision loss in right eye and visual halos.  Cardiovascular: Negative for chest pain, dyspnea on exertion, leg swelling, orthopnea and palpitations.  Respiratory: Negative for cough, hemoptysis, shortness of breath and snoring.   Endocrine: Negative for heat intolerance and  polyphagia.  Hematologic/Lymphatic: Negative for bleeding problem. Does not bruise/bleed easily.  Skin: Negative for flushing, nail changes, rash and suspicious lesions.  Musculoskeletal: Negative for arthritis, joint pain, muscle cramps, myalgias, neck pain and stiffness.  Gastrointestinal: Negative for abdominal pain, bowel incontinence, diarrhea and excessive appetite.  Genitourinary: Negative for decreased libido, genital sores and incomplete emptying.  Neurological: Negative for brief paralysis, focal weakness, headaches and loss of balance.  Psychiatric/Behavioral: Negative for altered mental status, depression and suicidal ideas.  Allergic/Immunologic: Negative for HIV exposure and persistent  infections.    EKGs/Labs/Other Studies Reviewed:    The following studies were reviewed today:   EKG: None today  Pharmacologic nuclear stress test January 2022  The left ventricular ejection fraction is normal (55-65%).  Nuclear stress EF: 60%.  There was no ST segment deviation noted during stress.  The study is normal.  This is a low risk study.     ZIO monitor The patient wore the monitor for 14 days starting 08/23/2020 . Indication: Dizziness  The minimum heart rate was 52  bpm, maximum heart rate was 150  bpm, and average heart rate was   bpm. Predominant underlying rhythm was Sinus Rhythm. First Degree AV Block was present  Premature atrial complexes were rare. Premature Ventricular complexes were rare.  No ventricular tachycardia, no pauses, no AV block, no supraventricular and no atrial fibrillation present. No patient triggered event or diary event noted.   Conclusion: Normal/Unremarkable study, with no evidence of significant arrhythmia.   Recent Labs: 08/23/2020: BUN 13; Creatinine, Ser 0.95; Magnesium 1.9; Potassium 4.2; Sodium 142; TSH 3.200  Recent Lipid Panel    Component Value Date/Time   CHOL 198 08/23/2020 1039   TRIG 192 (H) 08/23/2020 1039   HDL 34 (L) 08/23/2020 1039   CHOLHDL 5.8 (H) 08/23/2020 1039   LDLCALC 130 (H) 08/23/2020 1039    Physical Exam:    VS:  BP 130/84   Pulse 63   Ht 5\' 5"  (1.651 m)   Wt 157 lb 9.6 oz (71.5 kg)   SpO2 98%   BMI 26.23 kg/m     Wt Readings from Last 3 Encounters:  12/14/20 157 lb 9.6 oz (71.5 kg)  09/08/20 158 lb (71.7 kg)  08/23/20 158 lb 3.2 oz (71.8 kg)     GEN: Well nourished, well developed in no acute distress HEENT: Normal NECK: No JVD; No carotid bruits LYMPHATICS: No lymphadenopathy CARDIAC: S1S2 noted,RRR, no murmurs, rubs, gallops RESPIRATORY:  Clear to auscultation without rales, wheezing or rhonchi  ABDOMEN: Soft, non-tender, non-distended, +bowel sounds, no  guarding. EXTREMITIES: No edema, No cyanosis, no clubbing MUSCULOSKELETAL:  No deformity  SKIN: Warm and dry NEUROLOGIC:  Alert and oriented x 3, non-focal PSYCHIATRIC:  Normal affect, good insight  ASSESSMENT:    1. Primary hypertension   2. Vitamin D deficiency   3. Other chest pain    PLAN:     He is doing well from a cardiovascular standpoint.  No changes will be made to his medications.  We can review his test including his ZIO monitor, as well as his nuclear stress test.  He has no questions today.  He recently got lab work with his PCP these results has not been shared with 10/21/20.  We are going to request that for my review.  In terms of his chest pain I do not believe this any further diagnostic testing needs to be done in terms of ischemic evaluation.  The patient is in agreement with  the above plan. The patient left the office in stable condition.  The patient will follow up in 1 year or sooner if needed.   Medication Adjustments/Labs and Tests Ordered: Current medicines are reviewed at length with the patient today.  Concerns regarding medicines are outlined above.  No orders of the defined types were placed in this encounter.  No orders of the defined types were placed in this encounter.   Patient Instructions  Medication Instructions:  Your physician recommends that you continue on your current medications as directed. Please refer to the Current Medication list given to you today.  *If you need a refill on your cardiac medications before your next appointment, please call your pharmacy*   Lab Work: None If you have labs (blood work) drawn today and your tests are completely normal, you will receive your results only by: Marland Kitchen MyChart Message (if you have MyChart) OR . A paper copy in the mail If you have any lab test that is abnormal or we need to change your treatment, we will call you to review the results.   Testing/Procedures: None   Follow-Up: At [facility], you and your health needs are our priority.  As part of our continuing mission to provide you with exceptional heart care, we have created designated Provider Care Teams.  These Care Teams include your primary Cardiologist (physician) and Advanced Practice Providers (APPs -  Physician Assistants and Nurse Practitioners) who all work together to provide you with the care you need, when you need it.  We recommend signing up for the patient portal called "MyChart".  Sign up information is provided on this After Visit Summary.  MyChart is used to connect with patients for Virtual Visits (Telemedicine).  Patients are able to view lab/test results, encounter notes, upcoming appointments, etc.  Non-urgent messages can be sent to your provider as well.   To learn more about what you can do with MyChart, go to [email or web address].    Your next appointment:   1 year(s)  The format for your next appointment:   In Person  Provider:   Thomasene Ripple, DO   Other Instructions      Adopting a Healthy Lifestyle.  Know what a healthy weight is for you (roughly BMI <25) and aim to maintain this   Aim for 7+ servings of fruits and vegetables daily   65-80+ fluid ounces of water or unsweet tea for healthy kidneys   Limit to max 1 drink of alcohol per day; avoid smoking/tobacco   Limit animal fats in diet for cholesterol and heart health - choose grass fed whenever available   Avoid highly processed foods, and foods high in saturated/trans fats   Aim for low stress - take time to unwind and care for your mental health   Aim for 150 min of moderate intensity exercise weekly for heart health, and weights twice weekly for bone health   Aim for 7-9 hours of sleep daily   When it comes to diets, agreement about the perfect plan isnt easy to find, even among the experts. Experts at the [facility] of [employer] developed an idea known as the Healthy Eating Plate. Just imagine a plate  divided into logical, healthy portions.   The emphasis is on diet quality:   Load up on vegetables and fruits - one-half of your plate: Aim for color and variety, and remember that potatoes dont count.   Go for whole grains - one-quarter of your plate: Whole wheat,  barley, wheat berries, quinoa, oats, brown rice, and foods made with them. If you want pasta, go with whole wheat pasta.   Protein power - one-quarter of your plate: Fish, chicken, beans, and nuts are all healthy, versatile protein sources. Limit red meat.   The diet, however, does go beyond the plate, offering a few other suggestions.   Use healthy plant oils, such as olive, canola, soy, corn, sunflower and peanut. Check the labels, and avoid partially hydrogenated oil, which have unhealthy trans fats.   If youre thirsty, drink water. Coffee and tea are good in moderation, but skip sugary drinks and limit milk and dairy products to one or two daily servings.   The type of carbohydrate in the diet is more important than the amount. Some sources of carbohydrates, such as vegetables, fruits, whole grains, and beans-are healthier than others.   Finally, stay active  Signed, Thomasene RippleKardie Fontaine Hehl, DO  12/14/2020 9:24 AM    [facility]

## 2021-05-30 ENCOUNTER — Encounter: Payer: Self-pay | Admitting: Gastroenterology

## 2021-07-19 ENCOUNTER — Other Ambulatory Visit: Payer: Self-pay

## 2021-07-19 MED ORDER — ATORVASTATIN CALCIUM 20 MG PO TABS
20.0000 mg | ORAL_TABLET | Freq: Every day | ORAL | 3 refills | Status: DC
Start: 2021-07-19 — End: 2024-02-13

## 2021-07-19 NOTE — Telephone Encounter (Signed)
Atorvastatin 20 mg # 90 x 3 refills sent to [facility] per request

## 2021-08-08 DIAGNOSIS — R319 Hematuria, unspecified: Secondary | ICD-10-CM | POA: Insufficient documentation

## 2021-09-26 DIAGNOSIS — R42 Dizziness and giddiness: Secondary | ICD-10-CM | POA: Insufficient documentation

## 2021-12-27 DIAGNOSIS — Z6824 Body mass index (BMI) 24.0-24.9, adult: Secondary | ICD-10-CM | POA: Insufficient documentation

## 2022-03-29 DIAGNOSIS — R9431 Abnormal electrocardiogram [ECG] [EKG]: Secondary | ICD-10-CM | POA: Insufficient documentation

## 2022-03-29 DIAGNOSIS — Z0100 Encounter for examination of eyes and vision without abnormal findings: Secondary | ICD-10-CM | POA: Insufficient documentation

## 2022-07-09 DIAGNOSIS — R252 Cramp and spasm: Secondary | ICD-10-CM | POA: Insufficient documentation

## 2023-02-26 LAB — COLOGUARD: COLOGUARD: NEGATIVE

## 2023-04-11 DIAGNOSIS — Z789 Other specified health status: Secondary | ICD-10-CM | POA: Insufficient documentation

## 2023-06-18 HISTORY — PX: CATARACT EXTRACTION W/ INTRAOCULAR LENS IMPLANT: SHX1309

## 2023-07-22 DIAGNOSIS — Z136 Encounter for screening for cardiovascular disorders: Secondary | ICD-10-CM | POA: Insufficient documentation

## 2023-07-22 DIAGNOSIS — R8290 Unspecified abnormal findings in urine: Secondary | ICD-10-CM | POA: Insufficient documentation

## 2024-02-13 ENCOUNTER — Other Ambulatory Visit: Payer: Self-pay

## 2024-02-13 ENCOUNTER — Encounter: Payer: Self-pay | Admitting: *Deleted

## 2024-02-13 DIAGNOSIS — N400 Enlarged prostate without lower urinary tract symptoms: Secondary | ICD-10-CM | POA: Insufficient documentation

## 2024-02-13 DIAGNOSIS — M1990 Unspecified osteoarthritis, unspecified site: Secondary | ICD-10-CM | POA: Insufficient documentation

## 2024-02-13 DIAGNOSIS — E291 Testicular hypofunction: Secondary | ICD-10-CM | POA: Insufficient documentation

## 2024-02-13 DIAGNOSIS — I10 Essential (primary) hypertension: Secondary | ICD-10-CM | POA: Insufficient documentation

## 2024-02-13 DIAGNOSIS — N200 Calculus of kidney: Secondary | ICD-10-CM | POA: Insufficient documentation

## 2024-02-13 DIAGNOSIS — E785 Hyperlipidemia, unspecified: Secondary | ICD-10-CM | POA: Insufficient documentation

## 2024-02-13 DIAGNOSIS — R5383 Other fatigue: Secondary | ICD-10-CM | POA: Insufficient documentation

## 2024-02-13 DIAGNOSIS — R4189 Other symptoms and signs involving cognitive functions and awareness: Secondary | ICD-10-CM | POA: Insufficient documentation

## 2024-02-13 DIAGNOSIS — R0602 Shortness of breath: Secondary | ICD-10-CM | POA: Insufficient documentation

## 2024-02-13 DIAGNOSIS — L989 Disorder of the skin and subcutaneous tissue, unspecified: Secondary | ICD-10-CM | POA: Insufficient documentation

## 2024-02-14 ENCOUNTER — Ambulatory Visit

## 2024-02-14 VITALS — BP 100/60 | HR 70 | Resp 17 | Ht 65.0 in | Wt 150.0 lb

## 2024-02-14 DIAGNOSIS — R0602 Shortness of breath: Secondary | ICD-10-CM

## 2024-02-14 DIAGNOSIS — E782 Mixed hyperlipidemia: Secondary | ICD-10-CM

## 2024-02-14 DIAGNOSIS — I10 Essential (primary) hypertension: Secondary | ICD-10-CM | POA: Diagnosis not present

## 2024-02-14 MED ORDER — ASPIRIN 81 MG PO TBEC: 81.0000 mg | DELAYED_RELEASE_TABLET | Freq: Every day | ORAL | 3 refills | Status: AC

## 2024-02-14 NOTE — Progress Notes (Signed)
 Cardiology Consultation:    Date:  02/14/2024   ID:  Cyril Woodmansee, DOB Sep 26, 1950, MRN [id number]  PCP:  Duwaine Burnard Amble, NP  Cardiologist:  Alean SAUNDERS Adelise Buswell, MD   Referring MD: Duwaine Burnard Amble, NP   Chief Complaint  Patient presents with   New Patient (Initial Visit)   Hypertension   Shortness of Breath     ASSESSMENT AND PLAN:   Mr. 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  Now with mild MR and mild TR with EF 50 to 55% on echocardiogram June 2025  Problem List Items Addressed This Visit     Hyperlipidemia   Did not tolerate statins due to muscle aches.  Discontinued. Has remained on Zetia 10 mg once daily and takes over-the-counter fish oil. Tells me that he has not been taking Vascepa.      Relevant Medications   aspirin EC 81 MG tablet   Essential hypertension   Well-controlled on current regimen with amlodipine 5 mg once daily, losartan-hydrochlorothiazide 50 mg - 12.5 mg once daily. Continue the same. Target below 130/80 mmHg.      Relevant Medications   aspirin EC 81 MG tablet   Shortness of breath - Primary   Progressive symptoms of shortness of breath gradually over the last 4 to 5 years but more noticeable over the past few months.  Differential would be cardiac issues related to angina versus pulmonary issues. Recent echocardiogram at PCPs office 02/05/2024 reported LVEF 50 to 55% without any wall motion abnormality, grade 1 diastolic dysfunction, trace aortic insufficiency and mild MR and mild TR.  Reassess functional capacity and rule out  any ischemia with a treadmill EKG stress test nuclear imaging.  Also obtain blood work CBC, CMP, proBNP  Start aspirin 81 mg once daily. If the stress test is unremarkable will recommend referral to to pulmonologist.       Relevant Orders   EKG 12-Lead (Completed)   Comp Met (CMET)   Pro b natriuretic peptide (BNP)   CBC   MYOCARDIAL PERFUSION IMAGING   Return to clinic tentatively in 2 months.   History of Present Illness:    Dustin Sloan is a 73 y.o. male who is being seen today for the evaluation of shortness of breath at the request of Duwaine Burnard Amble, NP. Previously had a visit with cardiologist December 14, 2020  Pleasant man here for the visit by himself.  Lives with his wife at home.  Has a history of hypertension, prior chest pain evaluation with stress test nuclear imaging January 2022 noted no ischemia, dizziness evaluated with Zio patch monitor January 2022 was unremarkable.  Mentions he has had longstanding history of shortness of breath over 4 to 5 years, notably describes it as a sensation of shortness of breath or not having enough air when he tries to lay down.  He does have similar sensation during the day but despite this he is able to keep up with his activities including working in the yard and helping his family members in the shop.  Reports getting tired easily and feels out of breath.  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  And he suspects if he is having some lung issues related to that.  Never smoked.  Does not drink alcohol.  No illicit drug use.  Denies any other symptoms such as pedal edema, lightheadedness, dizziness or syncopal episodes.  Good compliance with his medications.  EKG in the clinic today sinus rhythm with heart rate 63/min, PR interval prolonged at 308 ms, QRS duration 80 ms no significant ST-T changes to suggest ischemia.  Normal  QTc.  Echocardiogram done 6/18-20 25 at [facility] reported LVEF 50 to 55% without wall motion abnormality, grade 1 diastolic dysfunction, trace aortic insufficiency, mild MR, mild TR, aortic root reported mildly dilated [no measurements available].  Images not available to review.  Chest CT from 6/05-2024 at [facility] noted no acute chest findings bilateral nephrolithiasis and a benign-appearing 1 cm liver hypodensity was reported.  Carotid [facility] no evidence of hemodynamically significant internal carotid artery stenosis bilaterally less than 50%. Past Medical History:  Diagnosis Date   Allergies    Arthritis    Asymptomatic varicose veins of both lower extremities 08/23/2020   Benign prostatic hyperplasia    Capsulitis of metatarsophalangeal (MTP) joint of right foot 04/14/2020   Chest pain 08/23/2020   Dizziness    Essential hypertension    Fatigue    Hyperlipidemia    Hypertension    Impaired cognition    Inflammatory dermatosis    Kidney stone    Low testosterone    Shortness of breath    Testicular hypofunction    Tight heel cords, acquired, bilateral 04/15/2020   Unstable angina pectoris (HCC) 08/23/2020   Vitamin D deficiency     Past Surgical History:  Procedure Laterality Date   APPENDECTOMY     2018   CATARACT EXTRACTION W/ INTRAOCULAR LENS IMPLANT Right 06/18/2023   LITHOTRIPSY     NECK SURGERY     1988, 2008    Current Medications: Current Meds  Medication Sig   albuterol (VENTOLIN HFA) 108 (90 Base) MCG/ACT inhaler Inhale 2 puffs into the lungs every 6 (six) hours as needed for wheezing or shortness of breath.   amLODipine (NORVASC) 5 MG tablet Take 5 mg by mouth daily.   aspirin EC 81 MG tablet Take 1 tablet (81 mg total) by mouth daily. Swallow whole.   Cholecalciferol (VITAMIN D-3) 125 MCG (5000 UT) TABS Take 5,000 Units by mouth daily.   ezetimibe (ZETIA) 10 MG tablet  Take 10 mg by mouth daily.   Garlic 300 MG CAPS Take 300 mg by mouth daily.   losartan-hydrochlorothiazide (HYZAAR) 50-12.5 MG tablet Take 1 tablet by mouth daily.   meclizine (ANTIVERT) 25 MG tablet Take 25 mg by mouth 3 (three) times daily as needed for dizziness.   meloxicam (MOBIC) 7.5 MG tablet Take 7.5 mg by mouth daily.   minocycline (MINOCIN) 100 MG capsule Take 100 mg by mouth daily as needed.   Multiple Vitamin (MULTIVITAMIN) tablet Take 1 tablet by mouth daily.   tadalafil (CIALIS) 5 MG tablet Take 5 mg by mouth daily.   tamsulosin (FLOMAX) 0.4 MG CAPS capsule Take 0.4 mg by mouth daily.   triamcinolone (KENALOG) 0.1 % Apply 1 application topically 3 (three) times daily.   [DISCONTINUED] icosapent Ethyl (VASCEPA) 1 g capsule Take 2 g by mouth daily.     Allergies:  Lipitor [atorvastatin  calcium ] and Oxycodone-acetaminophen   Social History   Socioeconomic History   Marital status: Married    Spouse name: Not on file   Number of children: 4   Years of education: Not on file   Highest education level: Not on file  Occupational History   Not on file  Tobacco Use   Smoking status: Never   Smokeless tobacco: Never  Vaping Use   Vaping status: Never Used  Substance and Sexual Activity   Alcohol use: Never   Drug use: Never   Sexual activity: Not on file  Other Topics Concern   Not on file  Social History Narrative   Not on file   Social Drivers of Health   Financial Resource Strain: Not on file  Food Insecurity: Not on file  Transportation Needs: Not on file  Physical Activity: Not on file  Stress: Not on file  Social Connections: Not on file     Family History: The patient's family history includes Diabetes in his mother; Kidney Stones in his mother and sister; Kidney disease in his mother. ROS:   Please see the history of present illness.    All 14 point review of systems negative except as described per history of present illness.  EKGs/Labs/Other  Studies Reviewed:    The following studies were reviewed today:    EKG:  EKG Interpretation Date/Time:  Friday February 14 2024 09:32:58 EDT Ventricular Rate:  63 PR Interval:  308 QRS Duration:  80 QT Interval:  390 QTC Calculation: 399 R Axis:   20  Text Interpretation: Sinus rhythm with 1st degree A-V block No previous ECGs available Confirmed by Liborio Hai reddy [phone]) on 02/14/2024 9:53:00 AM    Recent Labs: No results found for requested labs within last 365 days.  Recent Lipid Panel    Component Value Date/Time   CHOL 198 08/23/2020 1039   TRIG 192 (H) 08/23/2020 1039   HDL 34 (L) 08/23/2020 1039   CHOLHDL 5.8 (H) 08/23/2020 1039   LDLCALC 130 (H) 08/23/2020 1039    Physical Exam:    VS:  BP 100/60 (BP Location: Left Arm, Patient Position: Sitting, Cuff Size: Small)   Pulse 70   Resp 17   Ht 5' 5 (1.651 m)   Wt 150 lb (68 kg)   SpO2 96%   BMI 24.96 kg/m     Wt Readings from Last 3 Encounters:  02/14/24 150 lb (68 kg)  12/14/20 157 lb 9.6 oz (71.5 kg)  09/08/20 158 lb (71.7 kg)     GENERAL:  Well nourished, well developed in no acute distress NECK: No JVD; No carotid bruits CARDIAC: RRR, S1 and S2 present, no murmurs, no rubs, no gallops CHEST:  Clear to auscultation without rales, wheezing or rhonchi  Extremities: No pitting pedal edema. Pulses bilaterally symmetric with radial 2+ and dorsalis pedis 2+ NEUROLOGIC:  Alert and oriented x 3  Medication Adjustments/Labs and Tests Ordered: Current medicines are reviewed at length with the patient today.  Concerns regarding medicines are outlined above.  Orders Placed This Encounter  Procedures   Comp Met (CMET)   Pro b natriuretic peptide (BNP)   CBC   MYOCARDIAL PERFUSION IMAGING   EKG 12-Lead   Meds ordered this encounter  Medications   aspirin EC 81 MG tablet    Sig: Take 1 tablet (81 mg total) by mouth daily. Swallow whole.    Dispense:  90 tablet    Refill:  3  Signed, Alean jess Kobus, MD, MPH, [facility]. 02/14/2024 10:24 AM    [facility]

## 2024-02-14 NOTE — Assessment & Plan Note (Signed)
 Well-controlled on current regimen with amlodipine 5 mg once daily, losartan-hydrochlorothiazide 50 mg - 12.5 mg once daily. Continue the same. Target below 130/80 mmHg.

## 2024-02-14 NOTE — Assessment & Plan Note (Signed)
 Did not tolerate statins due to muscle aches.  Discontinued. Has remained on Zetia 10 mg once daily and takes over-the-counter fish oil. Tells me that he has not been taking Vascepa.

## 2024-02-14 NOTE — Assessment & Plan Note (Signed)
 Progressive symptoms of shortness of breath gradually over the last 4 to 5 years but more noticeable over the past few months.  Differential would be cardiac issues related to angina versus pulmonary issues. Recent echocardiogram at PCPs office 02/05/2024 reported LVEF 50 to 55% without any wall motion abnormality, grade 1 diastolic dysfunction, trace aortic insufficiency and mild MR and mild TR.  Reassess functional capacity and rule out any ischemia with a treadmill EKG stress test nuclear imaging.  Also obtain blood work CBC, CMP, proBNP  Start aspirin 81 mg once daily. If the stress test is unremarkable will recommend referral to to pulmonologist.

## 2024-02-14 NOTE — Patient Instructions (Signed)
 Medication Instructions:  Your physician has recommended you make the following change in your medication:   START: Aspirin 81 mg daily  *If you need a refill on your cardiac medications before your next appointment, please call your pharmacy*  Lab Work: Your physician recommends that you return for lab work in:   Labs today: CMP, CBC, Pro BNP  If you have labs (blood work) drawn today and your tests are completely normal, you will receive your results only by: MyChart Message (if you have MyChart) OR A paper copy in the mail If you have any lab test that is abnormal or we need to change your treatment, we will call you to review the results.  Testing/Procedures:   [facility] [address] [place], [place] [address] Phone:  [phone]    Please arrive 15 minutes prior to your appointment time for registration and insurance purposes.  The test will take approximately 3 to 4 hours to complete; you may bring reading material.  If someone comes with you to your appointment, they will need to remain in the main lobby due to limited space in the testing area. **If you are pregnant or breastfeeding, please notify the nuclear lab prior to your appointment**  How to prepare for your Myocardial Perfusion Test: Do not eat or drink 3 hours prior to your test, except you may have water. Do not consume products containing caffeine (regular or decaffeinated) 12 hours prior to your test. (ex: coffee, chocolate, sodas, tea). Do bring a list of your current medications with you.  If not listed below, you may take your medications as normal. HOLD Erectile dysfunction medication: Cialis Do wear comfortable clothes (no dresses or overalls) and walking shoes, tennis shoes preferred (No heels or open toe shoes are allowed). Do NOT wear cologne, perfume, aftershave, or lotions (deodorant is allowed). If these instructions are not followed, your test will have to be  rescheduled.  Please report to [address] for your test.  If you have questions or concerns about your appointment, you can call the [facility] at [phone].  If you cannot keep your appointment, please provide 24 hours notification to the Nuclear Lab, to avoid a possible $50 charge to your account.   Follow-Up: At [facility], you and your health needs are our priority.  As part of our continuing mission to provide you with exceptional heart care, our providers are all part of one team.  This team includes your primary Cardiologist (physician) and Advanced Practice Providers or APPs (Physician Assistants and Nurse Practitioners) who all work together to provide you with the care you need, when you need it.  Your next appointment:   2 month(s)  Provider:   Alean Kobus, MD    We recommend signing up for the patient portal called MyChart.  Sign up information is provided on this After Visit Summary.  MyChart is used to connect with patients for Virtual Visits (Telemedicine).  Patients are able to view lab/test results, encounter notes, upcoming appointments, etc.  Non-urgent messages can be sent to your provider as well.   To learn more about what you can do with MyChart, go to [email or web address].   Other Instructions None

## 2024-02-15 ENCOUNTER — Ambulatory Visit: Payer: Self-pay

## 2024-02-15 DIAGNOSIS — R9439 Abnormal result of other cardiovascular function study: Secondary | ICD-10-CM

## 2024-02-15 DIAGNOSIS — R0602 Shortness of breath: Secondary | ICD-10-CM

## 2024-02-15 LAB — CBC
Hematocrit: 44.8 % (ref 37.5–51.0)
Hemoglobin: 14.7 g/dL (ref 13.0–17.7)
MCH: 32.7 pg (ref 26.6–33.0)
MCHC: 32.8 g/dL (ref 31.5–35.7)
MCV: 100 fL — ABNORMAL HIGH (ref 79–97)
Platelets: 151 x10E3/uL (ref 150–450)
RBC: 4.5 x10E6/uL (ref 4.14–5.80)
RDW: 13.3 % (ref 11.6–15.4)
WBC: 4.5 x10E3/uL (ref 3.4–10.8)

## 2024-02-15 LAB — COMPREHENSIVE METABOLIC PANEL WITH GFR
ALT: 20 IU/L (ref 0–44)
AST: 20 IU/L (ref 0–40)
Albumin: 4.5 g/dL (ref 3.8–4.8)
Alkaline Phosphatase: 57 IU/L (ref 44–121)
BUN/Creatinine Ratio: 14 (ref 10–24)
BUN: 14 mg/dL (ref 8–27)
Bilirubin Total: 0.8 mg/dL (ref 0.0–1.2)
CO2: 23 mmol/L (ref 20–29)
Calcium: 9.7 mg/dL (ref 8.6–10.2)
Chloride: 103 mmol/L (ref 96–106)
Creatinine, Ser: 1 mg/dL (ref 0.76–1.27)
Globulin, Total: 2.3 g/dL (ref 1.5–4.5)
Glucose: 94 mg/dL (ref 70–99)
Potassium: 4.2 mmol/L (ref 3.5–5.2)
Sodium: 142 mmol/L (ref 134–144)
Total Protein: 6.8 g/dL (ref 6.0–8.5)
eGFR: 80 mL/min/{1.73_m2} (ref >59)

## 2024-02-15 LAB — PRO B NATRIURETIC PEPTIDE: NT-Pro BNP: 41 pg/mL (ref 0–376)

## 2024-02-18 ENCOUNTER — Telehealth: Payer: Self-pay

## 2024-02-18 ENCOUNTER — Telehealth: Payer: Self-pay | Admitting: *Deleted

## 2024-02-18 NOTE — Telephone Encounter (Signed)
 LVM per DPR- per Dr. Madireddy's note regarding normal lab results. Encouraged to call with any questions. Routed to PCP.

## 2024-02-18 NOTE — Telephone Encounter (Signed)
 Left detailed instructions for MPI study on pt's cell #.

## 2024-02-19 ENCOUNTER — Ambulatory Visit

## 2024-02-19 DIAGNOSIS — R0602 Shortness of breath: Secondary | ICD-10-CM

## 2024-02-19 LAB — MYOCARDIAL PERFUSION IMAGING
Angina Index: 0
Duke Treadmill Score: -5
Estimated workload: 10.1
Exercise duration (min): 7 min
Exercise duration (sec): 21 s
LV dias vol: 90 mL (ref 62–150)
LV sys vol: 28 mL
MPHR: 148 {beats}/min
Nuc Stress EF: 69 %
Peak HR: 142 {beats}/min
Percent HR: 95 %
RPE: 18
Rest HR: 60 {beats}/min
Rest Nuclear Isotope Dose: 10.6 mCi
SDS: 1
SRS: 0
SSS: 1
ST Depression (mm): 2.5 mm
Stress Nuclear Isotope Dose: 27.6 mCi
TID: 0.79

## 2024-02-19 MED ORDER — TECHNETIUM TC 99M TETROFOSMIN IV KIT
10.6000 | PACK | Freq: Once | INTRAVENOUS | Status: AC | PRN
  Administered 2024-02-19: 10.6 via INTRAVENOUS

## 2024-02-19 MED ORDER — TECHNETIUM TC 99M TETROFOSMIN IV KIT
27.6000 | PACK | Freq: Once | INTRAVENOUS | Status: AC | PRN
  Administered 2024-02-19: 27.6 via INTRAVENOUS

## 2024-02-19 NOTE — Progress Notes (Signed)
 Stress test results show somewhat divided results.  1 component showing abnormalities that may suggest blood flow problems, the nuclear imaging portion however showed no major abnormalities.  Given the discrepancy between the 2 components I would recommend proceeding with cardiac CT coronary angiogram study to more clearly assess and rule out any major narrowing or obstruction.

## 2024-02-24 DIAGNOSIS — E785 Hyperlipidemia, unspecified: Secondary | ICD-10-CM | POA: Diagnosis not present

## 2024-02-24 DIAGNOSIS — R0602 Shortness of breath: Secondary | ICD-10-CM | POA: Diagnosis not present

## 2024-02-24 DIAGNOSIS — I10 Essential (primary) hypertension: Secondary | ICD-10-CM | POA: Diagnosis not present

## 2024-02-25 ENCOUNTER — Telehealth: Payer: Self-pay

## 2024-02-25 NOTE — Telephone Encounter (Signed)
Patient states he was returning a call. Please advise 

## 2024-02-25 NOTE — Telephone Encounter (Signed)
-----   Message from Alean SAUNDERS Madireddy sent at 02/19/2024  6:26 PM EDT ----- Stress test results show somewhat divided results.  1 component showing abnormalities that may suggest blood flow problems, the nuclear imaging portion however showed no major abnormalities.  Given the discrepancy between the 2 components I would recommend proceeding with cardiac CT coronary angiogram study to more clearly assess and rule out any major narrowing or obstruction.  ----- Message ----- From: Bernie Lamar PARAS, MD Sent: 02/19/2024   4:57 PM EDT To: Alean SAUNDERS Madireddy, MD

## 2024-02-25 NOTE — Telephone Encounter (Signed)
 Results reviewed with pt as per Dr. Madireddy's note.  Pt verbalized understanding and had no additional questions. Routed to PCP.  Instructions reviewed with pt for cardiac CT and instructions will be mailed.

## 2024-02-25 NOTE — Telephone Encounter (Signed)
 Left vm to return call.

## 2024-02-27 MED ORDER — METOPROLOL TARTRATE 100 MG PO TABS: 100.0000 mg | ORAL_TABLET | Freq: Once | ORAL | 0 refills | Status: DC

## 2024-02-27 NOTE — Telephone Encounter (Signed)
-----   Message from Alean SAUNDERS Madireddy sent at 02/19/2024  6:26 PM EDT ----- Stress test results show somewhat divided results.  1 component showing abnormalities that may suggest blood flow problems, the nuclear imaging portion however showed no major abnormalities.  Given the discrepancy between the 2 components I would recommend proceeding with cardiac CT coronary angiogram study to more clearly assess and rule out any major narrowing or obstruction.  ----- Message ----- From: Bernie Lamar PARAS, MD Sent: 02/19/2024   4:57 PM EDT To: Alean SAUNDERS Madireddy, MD

## 2024-03-10 DIAGNOSIS — R0602 Shortness of breath: Secondary | ICD-10-CM | POA: Diagnosis not present

## 2024-03-10 DIAGNOSIS — G4700 Insomnia, unspecified: Secondary | ICD-10-CM | POA: Diagnosis not present

## 2024-04-03 ENCOUNTER — Encounter (HOSPITAL_BASED_OUTPATIENT_CLINIC_OR_DEPARTMENT_OTHER): Payer: Self-pay | Admitting: Radiology

## 2024-04-07 DIAGNOSIS — E559 Vitamin D deficiency, unspecified: Secondary | ICD-10-CM | POA: Diagnosis not present

## 2024-04-07 DIAGNOSIS — F5104 Psychophysiologic insomnia: Secondary | ICD-10-CM | POA: Diagnosis not present

## 2024-04-07 DIAGNOSIS — I10 Essential (primary) hypertension: Secondary | ICD-10-CM | POA: Diagnosis not present

## 2024-04-07 DIAGNOSIS — Z13220 Encounter for screening for lipoid disorders: Secondary | ICD-10-CM | POA: Diagnosis not present

## 2024-04-07 DIAGNOSIS — R590 Localized enlarged lymph nodes: Secondary | ICD-10-CM | POA: Diagnosis not present

## 2024-04-07 DIAGNOSIS — Z131 Encounter for screening for diabetes mellitus: Secondary | ICD-10-CM | POA: Diagnosis not present

## 2024-04-07 DIAGNOSIS — Z1321 Encounter for screening for nutritional disorder: Secondary | ICD-10-CM | POA: Diagnosis not present

## 2024-04-07 DIAGNOSIS — K769 Liver disease, unspecified: Secondary | ICD-10-CM | POA: Diagnosis not present

## 2024-04-07 DIAGNOSIS — R0602 Shortness of breath: Secondary | ICD-10-CM | POA: Diagnosis not present

## 2024-04-07 DIAGNOSIS — Z13228 Encounter for screening for other metabolic disorders: Secondary | ICD-10-CM | POA: Diagnosis not present

## 2024-04-09 ENCOUNTER — Ambulatory Visit (HOSPITAL_BASED_OUTPATIENT_CLINIC_OR_DEPARTMENT_OTHER)
Admission: RE | Admit: 2024-04-09 | Discharge: 2024-04-09 | Disposition: A | Discharge status: Home / Self Care | Source: Ambulatory Visit | Attending: Cardiology | Admitting: Cardiology

## 2024-04-09 ENCOUNTER — Encounter (HOSPITAL_BASED_OUTPATIENT_CLINIC_OR_DEPARTMENT_OTHER): Payer: Self-pay

## 2024-04-09 DIAGNOSIS — R9439 Abnormal result of other cardiovascular function study: Secondary | ICD-10-CM

## 2024-04-09 DIAGNOSIS — R0602 Shortness of breath: Secondary | ICD-10-CM

## 2024-04-09 MED ORDER — IOHEXOL 350 MG/ML SOLN: 95.0000 mL | Freq: Once | INTRAVENOUS | Status: DC | PRN

## 2024-04-09 MED ORDER — NITROGLYCERIN 0.4 MG SL SUBL: 0.8000 mg | SUBLINGUAL_TABLET | Freq: Once | SUBLINGUAL | Status: DC

## 2024-04-09 MED ORDER — DILTIAZEM HCL 25 MG/5ML IV SOLN: 10.0000 mg | INTRAVENOUS | Status: DC | PRN

## 2024-04-09 MED ORDER — METOPROLOL TARTRATE 5 MG/5ML IV SOLN: 10.0000 mg | Freq: Once | INTRAVENOUS | Status: DC | PRN

## 2024-04-09 NOTE — Progress Notes (Signed)
 Patient was unsure of last Cialis dose. This nurse spoke to Dr. Krasowski and he recommended to reschedule exam. Patient educated not to take Cialis for at least 72 hours prior to next scan. Patient verbalized understanding and states he will stop taking it 5 or 6 days prior to next scan.

## 2024-04-15 ENCOUNTER — Ambulatory Visit

## 2024-04-15 VITALS — BP 112/74 | HR 74 | Ht 65.0 in | Wt 148.8 lb

## 2024-04-15 DIAGNOSIS — R9439 Abnormal result of other cardiovascular function study: Secondary | ICD-10-CM | POA: Diagnosis not present

## 2024-04-15 DIAGNOSIS — I10 Essential (primary) hypertension: Secondary | ICD-10-CM | POA: Diagnosis not present

## 2024-04-15 DIAGNOSIS — E782 Mixed hyperlipidemia: Secondary | ICD-10-CM

## 2024-04-15 HISTORY — DX: Abnormal result of other cardiovascular function study: R94.39

## 2024-04-15 MED ORDER — METOPROLOL TARTRATE 100 MG PO TABS: 100.0000 mg | ORAL_TABLET | Freq: Once | ORAL | 0 refills | Status: DC

## 2024-04-15 NOTE — Assessment & Plan Note (Signed)
 Continue Zetia 10 mg once daily. Has not tolerated statins in the past due to muscle aches. If significant coronary atherosclerosis on cardiac CTA will follow-up with lipid panel and further optimize lipid-lowering therapy.

## 2024-04-15 NOTE — Assessment & Plan Note (Addendum)
 At last office visit he was describing symptoms of progressive dyspnea on exertion. Further evaluation with stress with nuclear imaging requested  Abnormal treadmill stress test with nuclear imaging 02/19/2024 with EKG portion abnormal at peak stress with 2.5 mm horizontal ST depression. Nuclear imaging portion however showed no major perfusion defects to suggest ischemia.  Given his symptoms, risk factors and discrepancy in the test results EKG and nuclear portion, recommended further evaluation with cardiac CT imaging.  This is now rescheduled for May 21, 2024. Overall functionally he is been doing well. Advised him to continue aspirin  81 mg once daily Prescribed metoprolol  tartrate one-time dose 100 mg for rate optimization prior to cardiac CT.  Will review the results once available.

## 2024-04-15 NOTE — Assessment & Plan Note (Signed)
 Well-controlled on current medication with amlodipine 5 mg once daily Losartan-hydrochlorothiazide 50 mg - 12.5 mg once daily. Target below 130/80 mmHg.

## 2024-04-15 NOTE — Patient Instructions (Signed)

## 2024-04-15 NOTE — Progress Notes (Signed)
 Cardiology Consultation:    Date:  04/15/2024   ID:  Dustin Sloan, DOB [birth year], MRN [id number]  PCP:  Duwaine Burnard Amble, NP  Cardiologist:  Alean SAUNDERS Aleida Crandell, MD   Referring MD: Duwaine Burnard Amble, NP   Chief Complaint  Patient presents with   Follow-up     ASSESSMENT AND PLAN:   Dustin Sloan 73 year old male history of hypertension, hyperlipidemia, statin intolerance, nephrolithiasis [identified incidentally on CT chest June 2025 at [facility] chronic symptoms of shortness of breath.  Non-smoker, does not drink alcohol or use any illicit drugs. 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 Reports evaluation for chest pain with stress test nuclear imaging in January 2022 that was unremarkable. Evaluation for dizziness symptoms at the time in January 2022 with Zio patch was unremarkable. Transthoracic echocardiogram 02/05/2024 at PCPs office reported LVEF 50 to 55%, no wall motion abnormality, grade 1 diastolic dysfunction, trace aortic insufficiency, mild Dustin and mild TR. Ultrasound carotids at PCPs office 01/29/2024 reported no evidence of hemodynamically significant stenosis.  Problem List Items Addressed This Visit     Hyperlipidemia   Continue Zetia 10 mg once daily. Has not tolerated statins in the past due to muscle aches. If significant coronary atherosclerosis on cardiac CTA will follow-up with lipid panel and further optimize lipid-lowering therapy.      Essential hypertension   Well-controlled on current medication with amlodipine 5 mg once daily Losartan-hydrochlorothiazide 50 mg - 12.5 mg once daily. Target below 130/80 mmHg.       Abnormal stress test - Primary   At last office visit he was describing symptoms of progressive dyspnea on exertion. Further evaluation with stress with nuclear imaging requested  Abnormal treadmill stress test with nuclear imaging 02/19/2024 with  EKG portion abnormal at peak stress with 2.5 mm horizontal ST depression. Nuclear imaging portion however showed no major perfusion defects to suggest ischemia.  Given his symptoms, risk factors and discrepancy in the test results EKG and nuclear portion, recommended further evaluation with cardiac CT imaging.  This is now rescheduled for May 21, 2024. Overall functionally he is been doing well. Advised him to continue aspirin  81 mg once daily Prescribed metoprolol  tartrate one-time dose 100 mg for rate optimization prior to cardiac CT.  Will review the results once available.         Return to clinic tentatively scheduled for 33-month follow-up.  Will have him come in earlier based on cardiac CT test results.   History of Present Illness:    Dustin Sloan is a 73 y.o. male who is being seen today for follow-up visit. PCP is Duwaine Burnard Amble, NP. Last visit with me in the office was 02/14/2024.  Lives with his wife at home.  Has history of hypertension, hyperlipidemia, statin intolerance, nephrolithiasis [identified incidentally on CT chest June 2025 at [facility] chronic symptoms of shortness of breath.  Non-smoker, does not drink alcohol or use any illicit drugs. 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 Reports evaluation for chest pain with stress test nuclear imaging in January 2022 that was unremarkable. Evaluation for dizziness symptoms at the time in January 2022 with Zio patch was unremarkable. Transthoracic echocardiogram 02/05/2024 at PCPs office reported LVEF 50 to 55%, no wall motion abnormality, grade 1 diastolic dysfunction, trace aortic insufficiency, mild Dustin and mild TR. Ultrasound carotids at PCPs office 01/29/2024  reported no evidence of hemodynamically significant stenosis.  At last office visit he was describing symptoms of progressive dyspnea on exertion. Further  evaluation with stress with nuclear imaging requested 02/19/2024 treadmill exercise MPI study noted 2.5 mm horizontal ST depression suggestive of ischemia.  Nuclear component however was unremarkable without any significant perfusion defects to suggest ischemia.  [See screenshot of EKG at peak stress below].  Given the discrepancy in the EKG and nuclear imaging portion and his symptoms further evaluation with cardiac CT coronary angiogram was recommended.  Cardiac CT coronary angiogram was scheduled for 04/09/2024 however there was concern about last dose of Cialis and hence the test was rescheduled and instructed to hold off on Cialis at least 72 hours prior to the procedure. Currently this is rescheduled for May 21, 2024.  Mentions overall he has been doing well. Keeps himself busy with household activities. Goes to walk at a local park with his wife and he does 5 laps. Denies any active ongoing symptoms of chest pain or shortness of breath with his regular day-to-day activities. Feels like his symptoms have leveled off.  Good compliance with medications.  Past Medical History:  Diagnosis Date   Abnormal urine 07/22/2023   Unspecified abnormal findings in urine     Allergies    Arthritis    Asymptomatic varicose veins of both lower extremities 08/23/2020   Benign prostatic hyperplasia    Blood in urine 08/08/2021   Hematuria, unspecifiedHematuria, unspecified; Start Date : 10/24/2022Hematuria, unspecified; Start Date : 03/14/2021     Body mass index (BMI) 24.0-24.9, adult 12/27/2021   Body mass index [BMI] 24.0-24.9, adultBody mass index [BMI] 24.0-24.9, adult; Start Date : 02/07/2023Body mass index [BMI] 24.0-24.9, adult; Start Date : 12/20/2022Body mass index [BMI] 24.0-24.9, adult; Start Date : 06/12/2021     Body mass index (BMI) 27.0-27.9, adult 02/24/2019   Body mass index [BMI] 27.0-27.9, adult     Capsulitis of metatarsophalangeal (MTP) joint of right foot 04/14/2020    Chest pain 08/23/2020   Disequilibrium 09/26/2021   Dizziness and giddinessDizziness and giddiness; Start Date : 04/22/2022Dizziness and giddiness; Start Date : 01/26/2022Dizziness and giddiness; Start Date : 12/17/2021Dizziness and giddiness; Start Date : 07/05/2020     Dizziness    Electrocardiogram abnormal 03/29/2022   Abnormal electrocardiogram [ECG] [EKG]     Encounter for examination of eyes and vision without abnormal findings 03/29/2022   Encounter for exam of eyes and vision w/o abnormal findingsEncounter for exam of eyes and vision w/o abnormal findings; Start Date : 07/26/2022Encounter for exam of eyes and vision w/o abnormal findings; Start Date : 04/29/2021Encounter for exam of eyes and vision w/o abnormal findings; Start Date : 02/24/2019     Encounter for screening for cardiovascular disorders 07/22/2023   Encounter for screening for cardiovascular disordersEncounter for screening for cardiovascular disorders; Start Date : 03/29/2022     Essential hypertension    Fatigue    Hyperlipidemia    Hypertension    Impaired cognition    Inflammatory dermatosis    Kidney stone    Low testosterone    Other specified health status 04/11/2023   Other specified health status     Pain in left foot 12/17/2019   Pain in left foot     Pain in right foot 12/17/2019   Pain in right foot     Shortness of breath    Spasm 07/09/2022   Cramp and spasmCramp and spasm; Start Date : 02/24/2019     Testicular hypofunction  Tight heel cords, acquired, 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   amLODipine (NORVASC) 5 MG tablet Take 5 mg by mouth daily.   aspirin  EC 81 MG tablet Take 1 tablet (81 mg total) by mouth daily. Swallow whole.   Cholecalciferol (VITAMIN D-3) 125 MCG (5000 UT) TABS Take 5,000 Units by mouth daily.   ezetimibe (ZETIA) 10 MG tablet Take 10 mg by mouth daily.   Garlic 300 MG CAPS Take 300 mg by mouth daily.   losartan-hydrochlorothiazide (HYZAAR) 50-12.5 MG tablet Take 1 tablet by mouth daily.   meclizine (ANTIVERT) 25 MG tablet Take 25 mg by mouth 3 (three) times daily as needed for dizziness.   meloxicam (MOBIC) 7.5 MG tablet Take 7.5 mg by mouth daily.   metoprolol  tartrate (LOPRESSOR ) 100 MG tablet Take 1 tablet (100 mg total) by mouth once for 1 dose. Take 2 hours prior to your CT if your heart rate is greater than 55   minocycline (MINOCIN) 100 MG capsule Take 100 mg by mouth daily as needed.   Multiple Vitamin (MULTIVITAMIN) tablet Take 1 tablet by mouth daily.   tadalafil (CIALIS) 5 MG tablet Take 5 mg by mouth daily.   tamsulosin (FLOMAX) 0.4 MG CAPS capsule Take 0.4 mg by mouth daily.   triamcinolone (KENALOG) 0.1 % Apply 1 application topically 3 (three) times daily.     Allergies:   Lipitor [atorvastatin  calcium ] and Oxycodone-acetaminophen   Social History   Socioeconomic History   Marital status: Married    Spouse name: Not on file   Number of children: 4   Years of education: Not on file   Highest education level: Not on file  Occupational History   Not on file  Tobacco Use   Smoking status: Never   Smokeless tobacco: Never  Vaping Use   Vaping status: Never Used  Substance and Sexual Activity   Alcohol use: Never   Drug use: Never   Sexual activity: Not on file  Other Topics Concern   Not on file  Social History Narrative   Not on file   Social Drivers of Health   Financial Resource Strain: Not on file  Food Insecurity: Not on file  Transportation Needs: Not on  file  Physical Activity: Not on file  Stress: Not on file  Social Connections: Not on file     Family History: The patient's family history includes Diabetes in his mother; Kidney Stones in his mother and sister; Kidney disease in his mother. ROS:   Please see the history of present illness.    All 14 point review of systems negative except as described per history of present illness.  EKGs/Labs/Other Studies Reviewed:    The following studies were reviewed today:   EKG:       Recent Labs: 02/14/2024: ALT 20; BUN 14; Creatinine, Ser 1.00; Hemoglobin 14.7; NT-Pro BNP 41; Platelets 151; Potassium 4.2; Sodium 142  Recent Lipid Panel    Component Value Date/Time  CHOL 198 08/23/2020 1039   TRIG 192 (H) 08/23/2020 1039   HDL 34 (L) 08/23/2020 1039   CHOLHDL 5.8 (H) 08/23/2020 1039   LDLCALC 130 (H) 08/23/2020 1039    Physical Exam:    VS:  BP 112/74   Pulse 74   Ht 5' 5 (1.651 m)   Wt 148 lb 12.8 oz (67.5 kg)   SpO2 97%   BMI 24.76 kg/m     Wt Readings from Last 3 Encounters:  04/15/24 148 lb 12.8 oz (67.5 kg)  02/14/24 150 lb (68 kg)  12/14/20 157 lb 9.6 oz (71.5 kg)     GENERAL:  Well nourished, well developed in no acute distress CARDIAC: RRR, S1 and S2 present, no murmurs, no rubs, no gallops CHEST:  Clear to auscultation without rales, wheezing or rhonchi  Extremities: No pitting pedal edema. Pulses bilaterally symmetric with radial 2+ and dorsalis pedis 2+ NEUROLOGIC:  Alert and oriented x 3  Medication Adjustments/Labs and Tests Ordered: Current medicines are reviewed at length with the patient today.  Concerns regarding medicines are outlined above.  No orders of the defined types were placed in this encounter.  No orders of the defined types were placed in this encounter.   Signed, Alean jess Kobus, MD, MPH, [facility]. 04/15/2024 1:39 PM    [facility]

## 2024-04-27 ENCOUNTER — Telehealth (HOSPITAL_COMMUNITY): Payer: Self-pay | Admitting: Emergency Medicine

## 2024-04-27 NOTE — Telephone Encounter (Signed)
 Attempted to call patient regarding upcoming cardiac CT appointment. Left message on voicemail with name and callback number Rockwell Alexandria RN Navigator Cardiac Imaging [facility] [phone] Office [phone] Cell

## 2024-04-28 ENCOUNTER — Ambulatory Visit (INDEPENDENT_AMBULATORY_CARE_PROVIDER_SITE_OTHER)
Admission: RE | Admit: 2024-04-28 | Discharge: 2024-04-28 | Disposition: A | Discharge status: Home / Self Care | Source: Ambulatory Visit | Attending: Cardiology | Admitting: Cardiology

## 2024-04-28 DIAGNOSIS — I2510 Atherosclerotic heart disease of native coronary artery without angina pectoris: Secondary | ICD-10-CM | POA: Diagnosis not present

## 2024-04-28 DIAGNOSIS — R0602 Shortness of breath: Secondary | ICD-10-CM

## 2024-04-28 DIAGNOSIS — R9439 Abnormal result of other cardiovascular function study: Secondary | ICD-10-CM | POA: Diagnosis not present

## 2024-04-28 MED ORDER — IOHEXOL 350 MG/ML SOLN
100.0000 mL | Freq: Once | INTRAVENOUS | Status: AC | PRN
  Administered 2024-04-28: 100 mL via INTRAVENOUS

## 2024-04-28 MED ORDER — METOPROLOL TARTRATE 5 MG/5ML IV SOLN: 10.0000 mg | INTRAVENOUS | Status: DC | PRN

## 2024-04-28 MED ORDER — DILTIAZEM HCL 25 MG/5ML IV SOLN: 10.0000 mg | INTRAVENOUS | Status: DC | PRN

## 2024-04-28 MED ORDER — NITROGLYCERIN 0.4 MG SL SUBL
0.8000 mg | SUBLINGUAL_TABLET | Freq: Once | SUBLINGUAL | Status: AC
  Administered 2024-04-28: 0.8 mg via SUBLINGUAL

## 2024-04-29 ENCOUNTER — Ambulatory Visit (HOSPITAL_BASED_OUTPATIENT_CLINIC_OR_DEPARTMENT_OTHER)
Admission: RE | Admit: 2024-04-29 | Discharge: 2024-04-29 | Disposition: A | Discharge status: Home / Self Care | Source: Ambulatory Visit | Attending: Cardiology | Admitting: Cardiology

## 2024-04-29 ENCOUNTER — Other Ambulatory Visit: Payer: Self-pay | Admitting: Cardiology

## 2024-04-29 DIAGNOSIS — I2510 Atherosclerotic heart disease of native coronary artery without angina pectoris: Secondary | ICD-10-CM | POA: Diagnosis not present

## 2024-04-29 DIAGNOSIS — R931 Abnormal findings on diagnostic imaging of heart and coronary circulation: Secondary | ICD-10-CM | POA: Diagnosis not present

## 2024-04-29 NOTE — Progress Notes (Signed)
 FFR Order

## 2024-05-01 ENCOUNTER — Ambulatory Visit: Payer: Self-pay | Admitting: Cardiology

## 2024-05-06 ENCOUNTER — Telehealth: Payer: Self-pay

## 2024-05-06 NOTE — Telephone Encounter (Signed)
 Sent to front desk to make appt. Per Dr. Krasowski

## 2024-05-07 ENCOUNTER — Ambulatory Visit

## 2024-05-07 VITALS — BP 120/68 | HR 68 | Ht 65.0 in | Wt 152.2 lb

## 2024-05-07 DIAGNOSIS — I2510 Atherosclerotic heart disease of native coronary artery without angina pectoris: Secondary | ICD-10-CM

## 2024-05-07 DIAGNOSIS — I10 Essential (primary) hypertension: Secondary | ICD-10-CM

## 2024-05-07 DIAGNOSIS — E782 Mixed hyperlipidemia: Secondary | ICD-10-CM | POA: Diagnosis not present

## 2024-05-07 DIAGNOSIS — I25118 Atherosclerotic heart disease of native coronary artery with other forms of angina pectoris: Secondary | ICD-10-CM | POA: Diagnosis not present

## 2024-05-07 HISTORY — DX: Atherosclerotic heart disease of native coronary artery without angina pectoris: I25.10

## 2024-05-07 MED ORDER — ROSUVASTATIN CALCIUM 5 MG PO TABS: 5.0000 mg | ORAL_TABLET | ORAL | 3 refills | Status: DC

## 2024-05-07 NOTE — Assessment & Plan Note (Signed)
 Well-controlled. Continue current medications amlodipine 5 mg once daily. Continue losartan-hydrochlorothiazide 50 mg - 12.5 mg once daily.

## 2024-05-07 NOTE — Progress Notes (Signed)
 Cardiology Consultation:    Date:  05/07/2024   ID:  Dustin Sloan, DOB February 22, 1951, MRN [id number]  PCP:  Dustin Burnard Amble, NP  Cardiologist:  Dustin SAUNDERS Sani Loiseau, MD   Referring MD: Dustin Burnard Amble, NP   No chief complaint on file.    ASSESSMENT AND PLAN:   Dustin Sloan 73 year old male  history of hypertension, hyperlipidemia, statin intolerance (only tried lipitor), asymptomatic sinus bradycardia and prolonged PR interval, nephrolithiasis [identified incidentally on CT chest June 2025 at [facility] chronic symptoms of shortness of breath.  Non-smoker, does not drink alcohol or use any illicit drugs. 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 Transthoracic echocardiogram 02/05/2024 at PCPs office reported LVEF 50 to 55%, no wall motion abnormality, grade 1 diastolic dysfunction, trace aortic insufficiency, mild Dustin and mild TR.   Here to review test results from cardiac CT.  Problem List Items Addressed This Visit     Hyperlipidemia   Did not tolerate Lipitor. Willing to try other statins. Will prescribe Crestor  5 mg 3 times a week. If tolerating well we will further escalate dose. If not tolerating Crestor  , will  start PCSK9 inhibitors.  Continue Zetia 10 mg once daily. Target LDL below 55 mg/dL.       Relevant Medications   rosuvastatin  (CRESTOR ) 5 MG tablet   Other Relevant Orders   EKG 12-Lead (Completed)   Basic Metabolic Panel (BMET)   CBC   Essential hypertension   Well-controlled. Continue current medications amlodipine 5 mg once daily. Continue losartan-hydrochlorothiazide 50 mg - 12.5 mg once daily.       Relevant Medications   rosuvastatin  (CRESTOR ) 5 MG tablet   Other Relevant Orders   EKG 12-Lead (Completed)   Basic Metabolic Panel (BMET)   CBC   CAD (coronary artery disease) - Primary    progressive symptoms of shortness of breath, stress nuclear  imaging July 2025 abnormal EKG portion and normal nuclear imaging portion of the study.  Procceded with Cardiac CT. Cardiac CT coronary angiogram 04/28/2024 noted severe coronary atherosclerosis calcium  score 3227, CAD RADS 4 disease in distal LAD and multiple moderate stenotic lesions.  CT FFR evaluation noted diffusely diseased arteries with decreased FFR in mid to distal LAD, mid to distal diagonal.  - Will contact us  with these abnormal results and likely hemodynamically significant lesions discuss further evaluation with cardiac cath to assess for any options for revascularization.  - Importance of continued medical therapy discussed extensively.  - Continue aspirin  81 mg once daily. Will try alternate statins Crestor  as he did not tolerate Lipitor. -Continue amlodipine 5 mg once daily. Not on beta-blockers due to sinus bradycardia and prolonged PR interval.  Discussed further evaluation with cardiac catheterization. Went over the procedure at length with regards to scheduling, sedation, catheter use, potential risks and benefits.  Alternative options of continued medical therapy reviewed. Shared Decision Making/Informed Consent{ The risks [stroke (1 in 1000), death (1 in 1000), kidney failure [usually temporary] (1 in 500), bleeding (1 in 200), allergic reaction [possibly serious] (1 in 200)], benefits (diagnostic support and management of coronary artery disease) and alternatives of a cardiac catheterization were discussed in detail with him and he is willing to proceed.  Will schedule tentatively for cardiac cath at [facility]. He wishes to get the procedure done the week of September 29th.  Advised him to avoid any moderate to heavy exertion.  Relevant Medications   rosuvastatin  (CRESTOR ) 5 MG tablet   Other Relevant Orders   EKG 12-Lead (Completed)   Basic Metabolic Panel (BMET)   CBC   Tentatively scheduled for 05/20/2024 with Dr. Ladona Return to clinic  tentatively in 4 weeks.  History of Present Illness:    Dustin Sloan is a 73 y.o. male who is being seen today for follow-up visit. PCP is Dustin Burnard Amble, NP. Last visit with me in the office was 04/15/2024.   history of hypertension, hyperlipidemia, statin intolerance, nephrolithiasis [identified incidentally on CT chest June 2025 at [facility] chronic symptoms of shortness of breath.  Non-smoker, does not drink alcohol or use any illicit drugs. 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 Transthoracic echocardiogram 02/05/2024 at PCPs office reported LVEF 50 to 55%, no wall motion abnormality, grade 1 diastolic dysfunction, trace aortic insufficiency, mild Dustin and mild TR. Ultrasound carotids at PCPs office 01/29/2024 reported no evidence of hemodynamically significant stenosis.  With progressive symptoms of shortness of breath evaluation with nuclear imaging stress test requested. 02/19/2024 stress nuclear imaging shows markedly abnormal EKG portion of the study however nuclear imaging portion did not show any significant perfusion deficits.  With concerns about false negative stress test further evaluation with cardiac CT was scheduled.  Cardiac CT coronary angiogram 04/28/2024 noted severe coronary atherosclerosis calcium  score 3227, CAD RADS 4 disease in distal LAD and multiple moderate stenotic lesions.  CT FFR evaluation noted diffusely diseased arteries with decreased FFR in mid to distal LAD, mid to distal diagonal.  Here for the visit today by himself. Mentions continues to be active helping out his son and brothers with their work in the garage. Takes his time to go about the job.  When he rushes send he does get out of breath. Denies chest pain.  Good compliance with medications.  EKG today in the clinic shows sinus rhythm heart rate 59/min, prolonged PR interval 286 ms, QRS duration 80  ms, no ischemic changes.   Past Medical History:  Diagnosis Date   Abnormal urine 07/22/2023   Unspecified abnormal findings in urine     Allergies    Arthritis    Asymptomatic varicose veins of both lower extremities 08/23/2020   Benign prostatic hyperplasia    Blood in urine 08/08/2021   Hematuria, unspecifiedHematuria, unspecified; Start Date : 10/24/2022Hematuria, unspecified; Start Date : 03/14/2021     Body mass index (BMI) 24.0-24.9, adult 12/27/2021   Body mass index [BMI] 24.0-24.9, adultBody mass index [BMI] 24.0-24.9, adult; Start Date : 02/07/2023Body mass index [BMI] 24.0-24.9, adult; Start Date : 12/20/2022Body mass index [BMI] 24.0-24.9, adult; Start Date : 06/12/2021     Body mass index (BMI) 27.0-27.9, adult 02/24/2019   Body mass index [BMI] 27.0-27.9, adult     Capsulitis of metatarsophalangeal (MTP) joint of right foot 04/14/2020   Chest pain 08/23/2020   Disequilibrium 09/26/2021   Dizziness and giddinessDizziness and giddiness; Start Date : 04/22/2022Dizziness and giddiness; Start Date : 01/26/2022Dizziness and giddiness; Start Date : 12/17/2021Dizziness and giddiness; Start Date : 07/05/2020     Dizziness    Electrocardiogram abnormal 03/29/2022   Abnormal electrocardiogram [ECG] [EKG]     Encounter for examination of eyes and vision without abnormal findings 03/29/2022   Encounter for exam of eyes and vision w/o abnormal findingsEncounter for exam of eyes and vision w/o abnormal findings; Start Date : 07/26/2022Encounter for exam of eyes and vision w/o  abnormal findings; Start Date : 04/29/2021Encounter for exam of eyes and vision w/o abnormal findings; Start Date : 02/24/2019     Encounter for screening for cardiovascular disorders 07/22/2023   Encounter for screening for cardiovascular disordersEncounter for screening for cardiovascular disorders; Start Date : 03/29/2022     Essential hypertension    Fatigue    Hyperlipidemia    Hypertension     Impaired cognition    Inflammatory dermatosis    Kidney stone    Low testosterone    Other specified health status 04/11/2023   Other specified health status     Pain in left foot 12/17/2019   Pain in left foot     Pain in right foot 12/17/2019   Pain in right foot     Shortness of breath    Spasm 07/09/2022   Cramp and spasmCramp and spasm; Start Date : 02/24/2019     Testicular hypofunction    Tight heel cords, acquired, bilateral 04/15/2020   Unstable angina pectoris (HCC) 08/23/2020   Vitamin D deficiency     Past Surgical History:  Procedure Laterality Date   APPENDECTOMY     2018   CATARACT EXTRACTION W/ INTRAOCULAR LENS IMPLANT Right 06/18/2023   LITHOTRIPSY     NECK SURGERY     1988, 2008    Current Medications: Current Meds  Medication Sig   albuterol (VENTOLIN HFA) 108 (90 Base) MCG/ACT inhaler Inhale 2 puffs into the lungs every 6 (six) hours as needed for wheezing or shortness of breath.   amLODipine (NORVASC) 5 MG tablet Take 5 mg by mouth daily.   aspirin  EC 81 MG tablet Take 1 tablet (81 mg total) by mouth daily. Swallow whole.   Cholecalciferol (VITAMIN D-3) 125 MCG (5000 UT) TABS Take 5,000 Units by mouth daily.   ezetimibe (ZETIA) 10 MG tablet Take 10 mg by mouth daily.   Garlic 300 MG CAPS Take 300 mg by mouth daily.   hydrOXYzine (ATARAX) 25 MG tablet Take 25 mg by mouth 2 (two) times daily.   losartan-hydrochlorothiazide (HYZAAR) 50-12.5 MG tablet Take 1 tablet by mouth daily.   meclizine (ANTIVERT) 25 MG tablet Take 25 mg by mouth 3 (three) times daily as needed for dizziness.   meloxicam (MOBIC) 7.5 MG tablet Take 7.5 mg by mouth daily.   minocycline (MINOCIN) 100 MG capsule Take 100 mg by mouth daily as needed.   Multiple Vitamin (MULTIVITAMIN) tablet Take 1 tablet by mouth daily.   rosuvastatin  (CRESTOR ) 5 MG tablet Take 1 tablet (5 mg total) by mouth every other day.   tadalafil (CIALIS) 5 MG tablet Take 5 mg by mouth daily.   tamsulosin  (FLOMAX) 0.4 MG CAPS capsule Take 0.4 mg by mouth daily.   triamcinolone (KENALOG) 0.1 % Apply 1 application topically 3 (three) times daily.     Allergies:   Lipitor [atorvastatin  calcium ] and Oxycodone-acetaminophen   Social History   Socioeconomic History   Marital status: Married    Spouse name: Not on file   Number of children: 4   Years of education: Not on file   Highest education level: Not on file  Occupational History   Not on file  Tobacco Use   Smoking status: Never   Smokeless tobacco: Never  Vaping Use   Vaping status: Never Used  Substance and Sexual Activity   Alcohol use: Never   Drug use: Never   Sexual activity: Not on file  Other Topics Concern   Not on file  Social History Narrative   Not on file   Social Drivers of Health   Financial Resource Strain: Not on file  Food Insecurity: Not on file  Transportation Needs: Not on file  Physical Activity: Not on file  Stress: Not on file  Social Connections: Not on file     Family History: The patient's family history includes Diabetes in his mother; Kidney Stones in his mother and sister; Kidney disease in his mother. ROS:   Please see the history of present illness.    All 14 point review of systems negative except as described per history of present illness.  EKGs/Labs/Other Studies Reviewed:    The following studies were reviewed today:   EKG:  EKG Interpretation Date/Time:  Thursday May 07 2024 17:30:11 EDT Ventricular Rate:  59 PR Interval:  286 QRS Duration:  80 QT Interval:  406 QTC Calculation: 401 R Axis:   31  Text Interpretation: Sinus bradycardia with 1st degree A-V block When compared with ECG of 14-Feb-2024 09:32, No significant change was found Confirmed by Liborio Hai reddy [phone]) on 05/07/2024 7:09:50 PM    Recent Labs: 02/14/2024: ALT 20; BUN 14; Creatinine, Ser 1.00; Hemoglobin 14.7; NT-Pro BNP 41; Platelets 151; Potassium 4.2; Sodium 142  Recent Lipid  Panel    Component Value Date/Time   CHOL 198 08/23/2020 1039   TRIG 192 (H) 08/23/2020 1039   HDL 34 (L) 08/23/2020 1039   CHOLHDL 5.8 (H) 08/23/2020 1039   LDLCALC 130 (H) 08/23/2020 1039    Physical Exam:    VS:  BP 120/68   Pulse 68   Ht 5' 5 (1.651 m)   Wt 152 lb 3.2 oz (69 kg)   SpO2 98%   BMI 25.33 kg/m     Wt Readings from Last 3 Encounters:  05/07/24 152 lb 3.2 oz (69 kg)  04/15/24 148 lb 12.8 oz (67.5 kg)  02/14/24 150 lb (68 kg)     GENERAL:  Well nourished, well developed in no acute distress NECK: No JVD; No carotid bruits CARDIAC: RRR, S1 and S2 present, no murmurs, no rubs, no gallops CHEST:  Clear to auscultation without rales, wheezing or rhonchi  Extremities: No pitting pedal edema. Pulses bilaterally symmetric with radial 2+ and dorsalis pedis 2+ NEUROLOGIC:  Alert and oriented x 3  Medication Adjustments/Labs and Tests Ordered: Current medicines are reviewed at length with the patient today.  Concerns regarding medicines are outlined above.  Orders Placed This Encounter  Procedures   Basic Metabolic Panel (BMET)   CBC   EKG 12-Lead   Meds ordered this encounter  Medications   rosuvastatin  (CRESTOR ) 5 MG tablet    Sig: Take 1 tablet (5 mg total) by mouth every other day.    Dispense:  90 tablet    Refill:  3    Signed, Darrielle Pflieger reddy Jazel Nimmons, MD, MPH, [facility]. 05/07/2024 7:13 PM    [facility]

## 2024-05-07 NOTE — Assessment & Plan Note (Signed)
 Did not tolerate Lipitor. Willing to try other statins. Will prescribe Crestor  5 mg 3 times a week. If tolerating well we will further escalate dose. If not tolerating Crestor  , will  start PCSK9 inhibitors.  Continue Zetia 10 mg once daily. Target LDL below 55 mg/dL.

## 2024-05-07 NOTE — Assessment & Plan Note (Addendum)
 progressive symptoms of shortness of breath, stress nuclear imaging July 2025 abnormal EKG portion and normal nuclear imaging portion of the study.  Procceded with Cardiac CT. Cardiac CT coronary angiogram 04/28/2024 noted severe coronary atherosclerosis calcium  score 3227, CAD RADS 4 disease in distal LAD and multiple moderate stenotic lesions.  CT FFR evaluation noted diffusely diseased arteries with decreased FFR in mid to distal LAD, mid to distal diagonal.  - Will contact us  with these abnormal results and likely hemodynamically significant lesions discuss further evaluation with cardiac cath to assess for any options for revascularization.  - Importance of continued medical therapy discussed extensively.  - Continue aspirin  81 mg once daily. Will try alternate statins Crestor  as he did not tolerate Lipitor. -Continue amlodipine 5 mg once daily. Not on beta-blockers due to sinus bradycardia and prolonged PR interval.  Discussed further evaluation with cardiac catheterization. Went over the procedure at length with regards to scheduling, sedation, catheter use, potential risks and benefits.  Alternative options of continued medical therapy reviewed. Shared Decision Making/Informed Consent{ The risks [stroke (1 in 1000), death (1 in 1000), kidney failure [usually temporary] (1 in 500), bleeding (1 in 200), allergic reaction [possibly serious] (1 in 200)], benefits (diagnostic support and management of coronary artery disease) and alternatives of a cardiac catheterization were discussed in detail with him and he is willing to proceed.  Will schedule tentatively for cardiac cath at [facility]. He wishes to get the procedure done the week of September 29th.  Advised him to avoid any moderate to heavy exertion.

## 2024-05-07 NOTE — H&P (View-Only) (Signed)
 Cardiology Consultation:    Date:  05/07/2024   ID:  Randale Carvalho, DOB February 22, 1951, MRN [id number]  PCP:  Duwaine Burnard Amble, NP  Cardiologist:  Alean SAUNDERS Sani Loiseau, MD   Referring MD: Duwaine Burnard Amble, NP   No chief complaint on file.    ASSESSMENT AND PLAN:   Mr Dustin Sloan 73 year old male  history of hypertension, hyperlipidemia, statin intolerance (only tried lipitor), asymptomatic sinus bradycardia and prolonged PR interval, nephrolithiasis [identified incidentally on CT chest June 2025 at [facility] chronic symptoms of shortness of breath.  Non-smoker, does not drink alcohol or use any illicit drugs. 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 Transthoracic echocardiogram 02/05/2024 at PCPs office reported LVEF 50 to 55%, no wall motion abnormality, grade 1 diastolic dysfunction, trace aortic insufficiency, mild MR and mild TR.   Here to review test results from cardiac CT.  Problem List Items Addressed This Visit     Hyperlipidemia   Did not tolerate Lipitor. Willing to try other statins. Will prescribe Crestor  5 mg 3 times a week. If tolerating well we will further escalate dose. If not tolerating Crestor  , will  start PCSK9 inhibitors.  Continue Zetia 10 mg once daily. Target LDL below 55 mg/dL.       Relevant Medications   rosuvastatin  (CRESTOR ) 5 MG tablet   Other Relevant Orders   EKG 12-Lead (Completed)   Basic Metabolic Panel (BMET)   CBC   Essential hypertension   Well-controlled. Continue current medications amlodipine 5 mg once daily. Continue losartan-hydrochlorothiazide 50 mg - 12.5 mg once daily.       Relevant Medications   rosuvastatin  (CRESTOR ) 5 MG tablet   Other Relevant Orders   EKG 12-Lead (Completed)   Basic Metabolic Panel (BMET)   CBC   CAD (coronary artery disease) - Primary    progressive symptoms of shortness of breath, stress nuclear  imaging July 2025 abnormal EKG portion and normal nuclear imaging portion of the study.  Procceded with Cardiac CT. Cardiac CT coronary angiogram 04/28/2024 noted severe coronary atherosclerosis calcium  score 3227, CAD RADS 4 disease in distal LAD and multiple moderate stenotic lesions.  CT FFR evaluation noted diffusely diseased arteries with decreased FFR in mid to distal LAD, mid to distal diagonal.  - Will contact us  with these abnormal results and likely hemodynamically significant lesions discuss further evaluation with cardiac cath to assess for any options for revascularization.  - Importance of continued medical therapy discussed extensively.  - Continue aspirin  81 mg once daily. Will try alternate statins Crestor  as he did not tolerate Lipitor. -Continue amlodipine 5 mg once daily. Not on beta-blockers due to sinus bradycardia and prolonged PR interval.  Discussed further evaluation with cardiac catheterization. Went over the procedure at length with regards to scheduling, sedation, catheter use, potential risks and benefits.  Alternative options of continued medical therapy reviewed. Shared Decision Making/Informed Consent{ The risks [stroke (1 in 1000), death (1 in 1000), kidney failure [usually temporary] (1 in 500), bleeding (1 in 200), allergic reaction [possibly serious] (1 in 200)], benefits (diagnostic support and management of coronary artery disease) and alternatives of a cardiac catheterization were discussed in detail with him and he is willing to proceed.  Will schedule tentatively for cardiac cath at [facility]. He wishes to get the procedure done the week of September 29th.  Advised him to avoid any moderate to heavy exertion.  Relevant Medications   rosuvastatin  (CRESTOR ) 5 MG tablet   Other Relevant Orders   EKG 12-Lead (Completed)   Basic Metabolic Panel (BMET)   CBC   Tentatively scheduled for 05/20/2024 with Dr. Ladona Return to clinic  tentatively in 4 weeks.  History of Present Illness:    Dustin Sloan is a 73 y.o. male who is being seen today for follow-up visit. PCP is Duwaine Burnard Amble, NP. Last visit with me in the office was 04/15/2024.   history of hypertension, hyperlipidemia, statin intolerance, nephrolithiasis [identified incidentally on CT chest June 2025 at [facility] chronic symptoms of shortness of breath.  Non-smoker, does not drink alcohol or use any illicit drugs. 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 Transthoracic echocardiogram 02/05/2024 at PCPs office reported LVEF 50 to 55%, no wall motion abnormality, grade 1 diastolic dysfunction, trace aortic insufficiency, mild MR and mild TR. Ultrasound carotids at PCPs office 01/29/2024 reported no evidence of hemodynamically significant stenosis.  With progressive symptoms of shortness of breath evaluation with nuclear imaging stress test requested. 02/19/2024 stress nuclear imaging shows markedly abnormal EKG portion of the study however nuclear imaging portion did not show any significant perfusion deficits.  With concerns about false negative stress test further evaluation with cardiac CT was scheduled.  Cardiac CT coronary angiogram 04/28/2024 noted severe coronary atherosclerosis calcium  score 3227, CAD RADS 4 disease in distal LAD and multiple moderate stenotic lesions.  CT FFR evaluation noted diffusely diseased arteries with decreased FFR in mid to distal LAD, mid to distal diagonal.  Here for the visit today by himself. Mentions continues to be active helping out his son and brothers with their work in the garage. Takes his time to go about the job.  When he rushes send he does get out of breath. Denies chest pain.  Good compliance with medications.  EKG today in the clinic shows sinus rhythm heart rate 59/min, prolonged PR interval 286 ms, QRS duration 80  ms, no ischemic changes.   Past Medical History:  Diagnosis Date   Abnormal urine 07/22/2023   Unspecified abnormal findings in urine     Allergies    Arthritis    Asymptomatic varicose veins of both lower extremities 08/23/2020   Benign prostatic hyperplasia    Blood in urine 08/08/2021   Hematuria, unspecifiedHematuria, unspecified; Start Date : 10/24/2022Hematuria, unspecified; Start Date : 03/14/2021     Body mass index (BMI) 24.0-24.9, adult 12/27/2021   Body mass index [BMI] 24.0-24.9, adultBody mass index [BMI] 24.0-24.9, adult; Start Date : 02/07/2023Body mass index [BMI] 24.0-24.9, adult; Start Date : 12/20/2022Body mass index [BMI] 24.0-24.9, adult; Start Date : 06/12/2021     Body mass index (BMI) 27.0-27.9, adult 02/24/2019   Body mass index [BMI] 27.0-27.9, adult     Capsulitis of metatarsophalangeal (MTP) joint of right foot 04/14/2020   Chest pain 08/23/2020   Disequilibrium 09/26/2021   Dizziness and giddinessDizziness and giddiness; Start Date : 04/22/2022Dizziness and giddiness; Start Date : 01/26/2022Dizziness and giddiness; Start Date : 12/17/2021Dizziness and giddiness; Start Date : 07/05/2020     Dizziness    Electrocardiogram abnormal 03/29/2022   Abnormal electrocardiogram [ECG] [EKG]     Encounter for examination of eyes and vision without abnormal findings 03/29/2022   Encounter for exam of eyes and vision w/o abnormal findingsEncounter for exam of eyes and vision w/o abnormal findings; Start Date : 07/26/2022Encounter for exam of eyes and vision w/o  abnormal findings; Start Date : 04/29/2021Encounter for exam of eyes and vision w/o abnormal findings; Start Date : 02/24/2019     Encounter for screening for cardiovascular disorders 07/22/2023   Encounter for screening for cardiovascular disordersEncounter for screening for cardiovascular disorders; Start Date : 03/29/2022     Essential hypertension    Fatigue    Hyperlipidemia    Hypertension     Impaired cognition    Inflammatory dermatosis    Kidney stone    Low testosterone    Other specified health status 04/11/2023   Other specified health status     Pain in left foot 12/17/2019   Pain in left foot     Pain in right foot 12/17/2019   Pain in right foot     Shortness of breath    Spasm 07/09/2022   Cramp and spasmCramp and spasm; Start Date : 02/24/2019     Testicular hypofunction    Tight heel cords, acquired, bilateral 04/15/2020   Unstable angina pectoris (HCC) 08/23/2020   Vitamin D deficiency     Past Surgical History:  Procedure Laterality Date   APPENDECTOMY     2018   CATARACT EXTRACTION W/ INTRAOCULAR LENS IMPLANT Right 06/18/2023   LITHOTRIPSY     NECK SURGERY     1988, 2008    Current Medications: Current Meds  Medication Sig   albuterol (VENTOLIN HFA) 108 (90 Base) MCG/ACT inhaler Inhale 2 puffs into the lungs every 6 (six) hours as needed for wheezing or shortness of breath.   amLODipine (NORVASC) 5 MG tablet Take 5 mg by mouth daily.   aspirin  EC 81 MG tablet Take 1 tablet (81 mg total) by mouth daily. Swallow whole.   Cholecalciferol (VITAMIN D-3) 125 MCG (5000 UT) TABS Take 5,000 Units by mouth daily.   ezetimibe (ZETIA) 10 MG tablet Take 10 mg by mouth daily.   Garlic 300 MG CAPS Take 300 mg by mouth daily.   hydrOXYzine (ATARAX) 25 MG tablet Take 25 mg by mouth 2 (two) times daily.   losartan-hydrochlorothiazide (HYZAAR) 50-12.5 MG tablet Take 1 tablet by mouth daily.   meclizine (ANTIVERT) 25 MG tablet Take 25 mg by mouth 3 (three) times daily as needed for dizziness.   meloxicam (MOBIC) 7.5 MG tablet Take 7.5 mg by mouth daily.   minocycline (MINOCIN) 100 MG capsule Take 100 mg by mouth daily as needed.   Multiple Vitamin (MULTIVITAMIN) tablet Take 1 tablet by mouth daily.   rosuvastatin  (CRESTOR ) 5 MG tablet Take 1 tablet (5 mg total) by mouth every other day.   tadalafil (CIALIS) 5 MG tablet Take 5 mg by mouth daily.   tamsulosin  (FLOMAX) 0.4 MG CAPS capsule Take 0.4 mg by mouth daily.   triamcinolone (KENALOG) 0.1 % Apply 1 application topically 3 (three) times daily.     Allergies:   Lipitor [atorvastatin  calcium ] and Oxycodone-acetaminophen   Social History   Socioeconomic History   Marital status: Married    Spouse name: Not on file   Number of children: 4   Years of education: Not on file   Highest education level: Not on file  Occupational History   Not on file  Tobacco Use   Smoking status: Never   Smokeless tobacco: Never  Vaping Use   Vaping status: Never Used  Substance and Sexual Activity   Alcohol use: Never   Drug use: Never   Sexual activity: Not on file  Other Topics Concern   Not on file  Social History Narrative   Not on file   Social Drivers of Health   Financial Resource Strain: Not on file  Food Insecurity: Not on file  Transportation Needs: Not on file  Physical Activity: Not on file  Stress: Not on file  Social Connections: Not on file     Family History: The patient's family history includes Diabetes in his mother; Kidney Stones in his mother and sister; Kidney disease in his mother. ROS:   Please see the history of present illness.    All 14 point review of systems negative except as described per history of present illness.  EKGs/Labs/Other Studies Reviewed:    The following studies were reviewed today:   EKG:  EKG Interpretation Date/Time:  Thursday May 07 2024 17:30:11 EDT Ventricular Rate:  59 PR Interval:  286 QRS Duration:  80 QT Interval:  406 QTC Calculation: 401 R Axis:   31  Text Interpretation: Sinus bradycardia with 1st degree A-V block When compared with ECG of 14-Feb-2024 09:32, No significant change was found Confirmed by Liborio Hai reddy [phone]) on 05/07/2024 7:09:50 PM    Recent Labs: 02/14/2024: ALT 20; BUN 14; Creatinine, Ser 1.00; Hemoglobin 14.7; NT-Pro BNP 41; Platelets 151; Potassium 4.2; Sodium 142  Recent Lipid  Panel    Component Value Date/Time   CHOL 198 08/23/2020 1039   TRIG 192 (H) 08/23/2020 1039   HDL 34 (L) 08/23/2020 1039   CHOLHDL 5.8 (H) 08/23/2020 1039   LDLCALC 130 (H) 08/23/2020 1039    Physical Exam:    VS:  BP 120/68   Pulse 68   Ht 5' 5 (1.651 m)   Wt 152 lb 3.2 oz (69 kg)   SpO2 98%   BMI 25.33 kg/m     Wt Readings from Last 3 Encounters:  05/07/24 152 lb 3.2 oz (69 kg)  04/15/24 148 lb 12.8 oz (67.5 kg)  02/14/24 150 lb (68 kg)     GENERAL:  Well nourished, well developed in no acute distress NECK: No JVD; No carotid bruits CARDIAC: RRR, S1 and S2 present, no murmurs, no rubs, no gallops CHEST:  Clear to auscultation without rales, wheezing or rhonchi  Extremities: No pitting pedal edema. Pulses bilaterally symmetric with radial 2+ and dorsalis pedis 2+ NEUROLOGIC:  Alert and oriented x 3  Medication Adjustments/Labs and Tests Ordered: Current medicines are reviewed at length with the patient today.  Concerns regarding medicines are outlined above.  Orders Placed This Encounter  Procedures   Basic Metabolic Panel (BMET)   CBC   EKG 12-Lead   Meds ordered this encounter  Medications   rosuvastatin  (CRESTOR ) 5 MG tablet    Sig: Take 1 tablet (5 mg total) by mouth every other day.    Dispense:  90 tablet    Refill:  3    Signed, Darrielle Pflieger reddy Jazel Nimmons, MD, MPH, [facility]. 05/07/2024 7:13 PM    [facility]

## 2024-05-07 NOTE — Patient Instructions (Signed)
 Medication Instructions:  Your physician has recommended you make the following change in your medication:   START: Rosuvastatin  5 mg every other day  *If you need a refill on your cardiac medications before your next appointment, please call your pharmacy*  Lab Work: Your physician recommends that you return for lab work in:   Labs today: BMP, CBC  If you have labs (blood work) drawn today and your tests are completely normal, you will receive your results only by: MyChart Message (if you have MyChart) OR A paper copy in the mail If you have any lab test that is abnormal or we need to change your treatment, we will call you to review the results.  Testing/Procedures:  [place] [facility] DEPT OF [facility] AT [place] [address] [place] [address] Dept: [phone] Loc: [phone]  Dustin Sloan  05/07/2024  You are scheduled for a Cardiac Catheterization on Wednesday, October 1 with Dr. Gordy Bergamo.  1. Please arrive at the [facility] (Main Entrance A) at [facility]: [address] [place], [place] [address] at 8:00 AM (This time is 2 hour(s) before your procedure to ensure your preparation).   Free valet parking service is available. You will check in at ADMITTING. The support person will be asked to wait in the waiting room.  It is OK to have someone drop you off and come back when you are ready to be discharged.    Special note: Every effort is made to have your procedure done on time. Please understand that emergencies sometimes delay scheduled procedures.  2. Diet: Nothing to eat after midnight.   3. Hydration: You need to be well hydrated before your procedure. On October 1, you may drink approved liquids (see below) until 2 hours before the procedure, with 16 oz of water as your last intake.   List of approved liquids water, clear juice, clear tea, black coffee, fruit juices, non-citric and without  pulp, carbonated beverages, Gatorade, Kool -Aid, plain Jello-O and plain ice popsicles.  4. Labs: You will need to have blood drawn on Thursday, September 18 at [facility]: [address] [place] . You do not need to be fasting.  5. Medication instructions in preparation for your procedure:   Contrast Allergy: No  Hold Hyzaar the morning of your cardiac cath  On the morning of your procedure, take your Aspirin  81 mg and any morning medicines NOT listed above.  You may use sips of water.  6. Plan to go home the same day, you will only stay overnight if medically necessary. 7. Bring a current list of your medications and current insurance cards. 8. You MUST have a responsible person to drive you home. 9. Someone MUST be with you the first 24 hours after you arrive home or your discharge will be delayed. 10. Please wear clothes that are easy to get on and off and wear slip-on shoes.  Thank you for allowing us  to care for you!   -- [facility]   Follow-Up: At [facility], you and your health needs are our priority.  As part of our continuing mission to provide you with exceptional heart care, our providers are all part of one team.  This team includes your primary Cardiologist (physician) and Advanced Practice Providers or APPs (Physician Assistants and Nurse Practitioners) who all work together to provide you with the care you need, when you need it.  Your next appointment:   4 week(s)  Provider:   Alean Kobus, MD    We recommend signing up for the patient portal called MyChart.  Sign up information is provided on this After Visit Summary.  MyChart is used to connect with patients for Virtual Visits (Telemedicine).  Patients are able to view lab/test results, encounter notes, upcoming appointments, etc.  Non-urgent messages can be sent to your provider as well.   To learn more about what you can do with MyChart, go to  [email or web address].   Other Instructions None

## 2024-05-08 DIAGNOSIS — I10 Essential (primary) hypertension: Secondary | ICD-10-CM | POA: Diagnosis not present

## 2024-05-08 DIAGNOSIS — I25118 Atherosclerotic heart disease of native coronary artery with other forms of angina pectoris: Secondary | ICD-10-CM | POA: Diagnosis not present

## 2024-05-08 DIAGNOSIS — E782 Mixed hyperlipidemia: Secondary | ICD-10-CM | POA: Diagnosis not present

## 2024-05-09 LAB — BASIC METABOLIC PANEL WITH GFR
BUN/Creatinine Ratio: 12 (ref 10–24)
BUN: 12 mg/dL (ref 8–27)
CO2: 25 mmol/L (ref 20–29)
Calcium: 9.4 mg/dL (ref 8.6–10.2)
Chloride: 103 mmol/L (ref 96–106)
Creatinine, Ser: 0.99 mg/dL (ref 0.76–1.27)
Glucose: 89 mg/dL (ref 70–99)
Potassium: 4.2 mmol/L (ref 3.5–5.2)
Sodium: 141 mmol/L (ref 134–144)
eGFR: 81 mL/min/1.73 (ref >59)

## 2024-05-09 LAB — CBC
Hematocrit: 44.2 % (ref 37.5–51.0)
Hemoglobin: 14.7 g/dL (ref 13.0–17.7)
MCH: 32.6 pg (ref 26.6–33.0)
MCHC: 33.3 g/dL (ref 31.5–35.7)
MCV: 98 fL — ABNORMAL HIGH (ref 79–97)
Platelets: 142 x10E3/uL — ABNORMAL LOW (ref 150–450)
RBC: 4.51 x10E6/uL (ref 4.14–5.80)
RDW: 12.9 % (ref 11.6–15.4)
WBC: 4.1 x10E3/uL (ref 3.4–10.8)

## 2024-05-19 ENCOUNTER — Telehealth: Payer: Self-pay | Admitting: *Deleted

## 2024-05-19 NOTE — Telephone Encounter (Signed)
 Cardiac Catheterization scheduled at [facility] for: Wednesday May 20, 2024 10 AM Arrival time [facility] Main Entrance A at: 8 AM  Diet: -Nothing to eat after midnight.  Hydration: -May drink clear liquids until 2 hours before the procedure.  Approved liquids: Water, clear tea, black coffee, fruit juices-non-citric and without pulp,Gatorade, plain Jello/popsicles.   -Please drink 16 oz of water 2 hours before procedure.  Medication instructions: -Hold:  Losartan/HCT-AM of procedure -Other usual morning medications can be taken including aspirin  81 mg.  Plan to go home the same day, you will only stay overnight if medically necessary.  You must have responsible adult to drive you home.  Someone must be with you the first 24 hours after you arrive home.  Reviewed procedure instructions with patient.

## 2024-05-20 ENCOUNTER — Ambulatory Visit (HOSPITAL_COMMUNITY)
Admission: RE | Admit: 2024-05-20 | Discharge: 2024-05-20 | Disposition: A | Discharge status: Home / Self Care | Attending: Cardiology | Admitting: Cardiology

## 2024-05-20 ENCOUNTER — Other Ambulatory Visit (HOSPITAL_COMMUNITY): Payer: Self-pay

## 2024-05-20 ENCOUNTER — Other Ambulatory Visit: Payer: Self-pay

## 2024-05-20 ENCOUNTER — Encounter (HOSPITAL_COMMUNITY)
Admission: RE | Disposition: A | Discharge status: Home / Self Care | Payer: Self-pay | Source: Home / Self Care | Attending: Cardiology

## 2024-05-20 DIAGNOSIS — I25118 Atherosclerotic heart disease of native coronary artery with other forms of angina pectoris: Secondary | ICD-10-CM

## 2024-05-20 DIAGNOSIS — I2510 Atherosclerotic heart disease of native coronary artery without angina pectoris: Secondary | ICD-10-CM | POA: Diagnosis not present

## 2024-05-20 DIAGNOSIS — E785 Hyperlipidemia, unspecified: Secondary | ICD-10-CM | POA: Insufficient documentation

## 2024-05-20 DIAGNOSIS — Z7982 Long term (current) use of aspirin: Secondary | ICD-10-CM | POA: Diagnosis not present

## 2024-05-20 DIAGNOSIS — Z79899 Other long term (current) drug therapy: Secondary | ICD-10-CM | POA: Insufficient documentation

## 2024-05-20 DIAGNOSIS — I10 Essential (primary) hypertension: Secondary | ICD-10-CM | POA: Diagnosis not present

## 2024-05-20 HISTORY — PX: RIGHT/LEFT HEART CATH AND CORONARY ANGIOGRAPHY: CATH118266

## 2024-05-20 LAB — POCT I-STAT EG7
Acid-Base Excess: 1 mmol/L (ref 0.0–2.0)
Acid-Base Excess: 2 mmol/L (ref 0.0–2.0)
Acid-Base Excess: 2 mmol/L (ref 0.0–2.0)
Bicarbonate: 27.2 mmol/L (ref 20.0–28.0)
Bicarbonate: 27.4 mmol/L (ref 20.0–28.0)
Bicarbonate: 27.5 mmol/L (ref 20.0–28.0)
Calcium, Ion: 1.24 mmol/L (ref 1.15–1.40)
Calcium, Ion: 1.26 mmol/L (ref 1.15–1.40)
Calcium, Ion: 1.27 mmol/L (ref 1.15–1.40)
HCT: 41 % (ref 39.0–52.0)
HCT: 42 % (ref 39.0–52.0)
HCT: 42 % (ref 39.0–52.0)
Hemoglobin: 13.9 g/dL (ref 13.0–17.0)
Hemoglobin: 14.3 g/dL (ref 13.0–17.0)
Hemoglobin: 14.3 g/dL (ref 13.0–17.0)
O2 Saturation: 74 %
O2 Saturation: 76 %
O2 Saturation: 78 %
Potassium: 3.8 mmol/L (ref 3.5–5.1)
Potassium: 3.8 mmol/L (ref 3.5–5.1)
Potassium: 3.9 mmol/L (ref 3.5–5.1)
Sodium: 142 mmol/L (ref 135–145)
Sodium: 142 mmol/L (ref 135–145)
Sodium: 142 mmol/L (ref 135–145)
TCO2: 29 mmol/L (ref 22–32)
TCO2: 29 mmol/L (ref 22–32)
TCO2: 29 mmol/L (ref 22–32)
pCO2, Ven: 46.6 mmHg (ref 44–60)
pCO2, Ven: 46.6 mmHg (ref 44–60)
pCO2, Ven: 46.6 mmHg (ref 44–60)
pH, Ven: 7.374 (ref 7.25–7.43)
pH, Ven: 7.377 (ref 7.25–7.43)
pH, Ven: 7.379 (ref 7.25–7.43)
pO2, Ven: 40 mmHg (ref 32–45)
pO2, Ven: 42 mmHg (ref 32–45)
pO2, Ven: 44 mmHg (ref 32–45)

## 2024-05-20 LAB — POCT I-STAT 7, (LYTES, BLD GAS, ICA,H+H)
Acid-Base Excess: 1 mmol/L (ref 0.0–2.0)
Bicarbonate: 26.3 mmol/L (ref 20.0–28.0)
Calcium, Ion: 1.27 mmol/L (ref 1.15–1.40)
HCT: 43 % (ref 39.0–52.0)
Hemoglobin: 14.6 g/dL (ref 13.0–17.0)
O2 Saturation: 98 %
Potassium: 3.9 mmol/L (ref 3.5–5.1)
Sodium: 142 mmol/L (ref 135–145)
TCO2: 28 mmol/L (ref 22–32)
pCO2 arterial: 42.4 mmHg (ref 32–48)
pH, Arterial: 7.4 (ref 7.35–7.45)
pO2, Arterial: 99 mmHg (ref 83–108)

## 2024-05-20 SURGERY — RIGHT/LEFT HEART CATH AND CORONARY ANGIOGRAPHY
Anesthesia: LOCAL

## 2024-05-20 MED ORDER — VERAPAMIL HCL 2.5 MG/ML IV SOLN
INTRAVENOUS | Status: AC
  Filled 2024-05-20: qty 2

## 2024-05-20 MED ORDER — LIDOCAINE HCL (PF) 1 % IJ SOLN
INTRAMUSCULAR | Status: DC | PRN
  Administered 2024-05-20: 5 mL via INTRADERMAL

## 2024-05-20 MED ORDER — ACETAMINOPHEN 325 MG PO TABS: 650.0000 mg | ORAL_TABLET | ORAL | Status: DC | PRN

## 2024-05-20 MED ORDER — ONDANSETRON HCL 4 MG/2ML IJ SOLN: 4.0000 mg | Freq: Four times a day (QID) | INTRAMUSCULAR | Status: DC | PRN

## 2024-05-20 MED ORDER — MIDAZOLAM HCL 2 MG/2ML IJ SOLN
INTRAMUSCULAR | Status: AC
  Filled 2024-05-20: qty 2

## 2024-05-20 MED ORDER — SODIUM CHLORIDE 0.9% FLUSH: 3.0000 mL | Freq: Two times a day (BID) | INTRAVENOUS | Status: DC

## 2024-05-20 MED ORDER — MIDAZOLAM HCL 2 MG/2ML IJ SOLN
INTRAMUSCULAR | Status: DC | PRN
  Administered 2024-05-20: 2 mg via INTRAVENOUS

## 2024-05-20 MED ORDER — SODIUM CHLORIDE 0.9 % IV SOLN: 250.0000 mL | INTRAVENOUS | Status: DC | PRN

## 2024-05-20 MED ORDER — ASPIRIN 81 MG PO CHEW: 81.0000 mg | CHEWABLE_TABLET | ORAL | Status: DC

## 2024-05-20 MED ORDER — VERAPAMIL HCL 2.5 MG/ML IV SOLN: INTRAVENOUS | Status: DC | PRN

## 2024-05-20 MED ORDER — FENTANYL CITRATE (PF) 100 MCG/2ML IJ SOLN
INTRAMUSCULAR | Status: AC
  Filled 2024-05-20: qty 2

## 2024-05-20 MED ORDER — LIDOCAINE HCL (PF) 1 % IJ SOLN
INTRAMUSCULAR | Status: AC
  Filled 2024-05-20: qty 30

## 2024-05-20 MED ORDER — ROSUVASTATIN CALCIUM 5 MG PO TABS
10.0000 mg | ORAL_TABLET | ORAL | Status: DC
Start: 2024-05-20 — End: 2024-05-20

## 2024-05-20 MED ORDER — ROSUVASTATIN CALCIUM 20 MG PO TABS
20.0000 mg | ORAL_TABLET | Freq: Every day | ORAL | 2 refills | Status: DC
  Filled 2024-05-20: qty 30, 30d supply, fill #0

## 2024-05-20 MED ORDER — HEPARIN SODIUM (PORCINE) 1000 UNIT/ML IJ SOLN
INTRAMUSCULAR | Status: DC | PRN
  Administered 2024-05-20: 4000 [IU] via INTRAVENOUS

## 2024-05-20 MED ORDER — SODIUM CHLORIDE 0.9% FLUSH: 3.0000 mL | INTRAVENOUS | Status: DC | PRN

## 2024-05-20 MED ORDER — FREE WATER: 500.0000 mL | Freq: Once | Status: DC

## 2024-05-20 MED ORDER — HEPARIN SODIUM (PORCINE) 1000 UNIT/ML IJ SOLN
INTRAMUSCULAR | Status: AC
  Filled 2024-05-20: qty 10

## 2024-05-20 MED ORDER — FENTANYL CITRATE (PF) 100 MCG/2ML IJ SOLN
INTRAMUSCULAR | Status: DC | PRN
  Administered 2024-05-20: 25 ug via INTRAVENOUS

## 2024-05-20 MED ORDER — IOHEXOL 350 MG/ML SOLN
INTRAVENOUS | Status: DC | PRN
  Administered 2024-05-20: 50 mL

## 2024-05-20 SURGICAL SUPPLY — 12 items
CATH BALLN WEDGE 5F 110CM (CATHETERS) IMPLANT
CATH INFINITI 5FR JL4 (CATHETERS) IMPLANT
CATH INFINITI AMBI 5FR TG (CATHETERS) IMPLANT
COVER PRB 48X5XTLSCP FOLD TPE (BAG) IMPLANT
DEVICE RAD COMP TR BAND LRG (VASCULAR PRODUCTS) IMPLANT
GLIDESHEATH SLEND A-KIT 6F 22G (SHEATH) IMPLANT
GUIDEWIRE .025 260CM (WIRE) IMPLANT
GUIDEWIRE INQWIRE 1.5J.035X260 (WIRE) IMPLANT
KIT HEMO VALVE WATCHDOG (MISCELLANEOUS) IMPLANT
PACK CARDIAC CATHETERIZATION (CUSTOM PROCEDURE TRAY) ×1 IMPLANT
SET ATX-X65L (MISCELLANEOUS) IMPLANT
SHEATH GLIDE SLENDER 4/5FR (SHEATH) IMPLANT

## 2024-05-20 NOTE — Interval H&P Note (Signed)
 History and Physical Interval Note:  05/20/2024 10:53 AM  Dustin Sloan  has presented today for surgery, with the diagnosis of coronary artery disease.  The various methods of treatment have been discussed with the patient and family. After consideration of risks, benefits and other options for treatment, the patient has consented to  Procedure(s): LEFT HEART CATH AND CORONARY ANGIOGRAPHY (N/A), right heart catheterization for dyspnea class III and possible coronary angioplasty as a surgical intervention.  The patient's history has been reviewed, patient examined, no change in status, stable for surgery.  I have reviewed the patient's chart and labs.  Questions were answered to the patient's satisfaction.     Gordy Bergamo

## 2024-05-20 NOTE — Progress Notes (Signed)
 TR Band removed, pt then ambulated to the bathroom and back, once returned to the room it was noted PT had a 2mm hematoma present on r forearm. Pressure was applied for 12 minutes hematoma resolved. Forearm remains soft and boggy no s/s of hematoma present. PT was place on bed rest for an additional hour. No s/s of complications present PT ambulated in the hallway with no complications. PT set for discharge.

## 2024-05-20 NOTE — Discharge Instructions (Signed)

## 2024-05-20 NOTE — Progress Notes (Signed)
 Discharge instructions reviewed with pt and wife faye at bedside. Denies questions or concerns. Verbalized understanding of instructions. PT was able to void without difficulty prior to DC. PT was escorted from the unit via wheelchair to personal vehicle.

## 2024-05-21 ENCOUNTER — Other Ambulatory Visit (HOSPITAL_BASED_OUTPATIENT_CLINIC_OR_DEPARTMENT_OTHER): Admitting: Radiology

## 2024-05-21 ENCOUNTER — Encounter (HOSPITAL_COMMUNITY): Payer: Self-pay | Admitting: Cardiology

## 2024-05-26 ENCOUNTER — Telehealth: Payer: Self-pay

## 2024-05-26 ENCOUNTER — Ambulatory Visit

## 2024-05-26 DIAGNOSIS — I25118 Atherosclerotic heart disease of native coronary artery with other forms of angina pectoris: Secondary | ICD-10-CM | POA: Diagnosis not present

## 2024-05-26 LAB — ECHOCARDIOGRAM COMPLETE
Area-P 1/2: 4.26 cm2
S' Lateral: 2.9 cm

## 2024-05-26 NOTE — Telephone Encounter (Signed)
-----   Message from [place] R Madireddy sent at 05/22/2024  9:23 AM EDT ----- Regarding: FW: ECHO Bricen Victory,  Please arrange for a Transthoracic echocardiogram to be completed, expedited prior to visit with CT surgeon coming up next week. Please have this done at our office on one of [facility] facilities, so the images are available to review by the surgeon. Thank you. ----- Message ----- From: Krusinski, Allyssa A, RN Sent: 05/22/2024   9:12 AM EDT To: Alean JONELLE Kobus, MD; Con GORMAN Clunes, MD Subject: ECHO                                           Hey,  Dr. Ladona referred this patient to us  for CABG after this cath on Wednesday. It was discussed to get an updated ECHO. I do not see that it has been ordered or scheduled yet. Can you please get one? Patient is currently scheduled to see Dr. Clunes next Thursday 10/9 if you can get prior to appt, if not we will adjust that appt as needed.  Thanks, Allyssa, RN

## 2024-05-28 ENCOUNTER — Ambulatory Visit

## 2024-05-28 ENCOUNTER — Other Ambulatory Visit: Payer: Self-pay | Admitting: *Deleted

## 2024-05-28 VITALS — BP 146/90 | HR 80 | Resp 20 | Ht 65.0 in | Wt 154.4 lb

## 2024-05-28 DIAGNOSIS — I25118 Atherosclerotic heart disease of native coronary artery with other forms of angina pectoris: Secondary | ICD-10-CM | POA: Diagnosis not present

## 2024-05-28 NOTE — Progress Notes (Signed)
 [address] Zone [place] [address]             [phone]    [name] [facility] Medical Record #[id number] Date of Birth: Dec 26, 1950  Referring: Ladona Heinz, MD Primary Care: Duwaine Burnard Amble, NP Primary Cardiologist:None  Chief Complaint:    Chief Complaint  Patient presents with   Coronary Artery Disease    New patient consultation, CATH 10/1, ECHO 10/7    History of Present Illness:     Dustin Sloan is a 73 y.o. male who presents for surgical evaluation of multivessel coronary artery disease.  The patient is very anxious and has reports progressive fatigue and shortness of breath that has worsened.  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  He reports chest pain that feels like a tremble but that it comes and goes.  He has never had a stroke but has a son who has had 2 strokes.  He reports orthopnea (although likely anxiety related) and sometimes gets dizzy.  He has also had a lot of kidney stones in the past.  He is a never smoker, does not use alcohol.  Reports that he has been difficult to put to sleep in the past and was awake for arm surgery and colonoscopy.  LHC (05/20/24): LAD, OM2 and PDA disease. TTE (05/26/24): LVEF 60-65%, normal RV function. No AI, no AS; no MR, no MS CT chest (04/29/24): No root or ascending aortic calcium   Past Medical and Surgical History: Previous Chest Surgery: No Previous Chest Radiation: No Diabetes Mellitus: No.  HbA1C N/A Anticoagulation: No, Last dose N/A  Creatinine:  Lab Results  Component Value Date   CREATININE 0.99 05/08/2024   CREATININE 1.00 02/14/2024   CREATININE 0.95 08/23/2020     Past Medical History:  Diagnosis Date   Abnormal urine 07/22/2023   Unspecified abnormal findings in urine     Allergies    Arthritis    Asymptomatic varicose veins of both lower extremities 08/23/2020   Benign prostatic hyperplasia    Blood in urine  08/08/2021   Hematuria, unspecifiedHematuria, unspecified; Start Date : 10/24/2022Hematuria, unspecified; Start Date : 03/14/2021     Body mass index (BMI) 24.0-24.9, adult 12/27/2021   Body mass index [BMI] 24.0-24.9, adultBody mass index [BMI] 24.0-24.9, adult; Start Date : 02/07/2023Body mass index [BMI] 24.0-24.9, adult; Start Date : 12/20/2022Body mass index [BMI] 24.0-24.9, adult; Start Date : 06/12/2021     Body mass index (BMI) 27.0-27.9, adult 02/24/2019   Body mass index [BMI] 27.0-27.9, adult     Capsulitis of metatarsophalangeal (MTP) joint of right foot 04/14/2020   Chest pain 08/23/2020   Disequilibrium 09/26/2021   Dizziness and giddinessDizziness and giddiness; Start Date : 04/22/2022Dizziness and giddiness; Start Date : 01/26/2022Dizziness and giddiness; Start Date : 12/17/2021Dizziness and giddiness; Start Date : 07/05/2020     Dizziness    Electrocardiogram abnormal 03/29/2022   Abnormal electrocardiogram [ECG] [EKG]     Encounter for examination of eyes and vision without abnormal findings 03/29/2022   Encounter for exam of eyes and vision w/o abnormal findingsEncounter for exam of eyes and vision w/o abnormal findings; Start Date : 07/26/2022Encounter for exam of eyes and vision w/o abnormal findings; Start Date : 04/29/2021Encounter for exam of eyes and vision w/o abnormal findings; Start Date : 02/24/2019     Encounter for screening for cardiovascular disorders 07/22/2023   Encounter for  screening for cardiovascular disordersEncounter for screening for cardiovascular disorders; Start Date : 03/29/2022     Essential hypertension    Fatigue    Hyperlipidemia    Hypertension    Impaired cognition    Inflammatory dermatosis    Kidney stone    Low testosterone    Other specified health status 04/11/2023   Other specified health status     Pain in left foot 12/17/2019   Pain in left foot     Pain in right foot 12/17/2019   Pain in right foot     Shortness  of breath    Spasm 07/09/2022   Cramp and spasmCramp and spasm; Start Date : 02/24/2019     Testicular hypofunction    Tight heel cords, acquired, bilateral 04/15/2020   Unstable angina pectoris (HCC) 08/23/2020   Vitamin D deficiency     Past Surgical History:  Procedure Laterality Date   APPENDECTOMY     2018   CATARACT EXTRACTION W/ INTRAOCULAR LENS IMPLANT Right 06/18/2023   LITHOTRIPSY     NECK SURGERY     1988, 2008   RIGHT/LEFT HEART CATH AND CORONARY ANGIOGRAPHY N/A 05/20/2024   Procedure: RIGHT/LEFT HEART CATH AND CORONARY ANGIOGRAPHY;  Surgeon: Ladona Heinz, MD;  Location: [facility];  Service: Cardiovascular;  Laterality: N/A;    Social History:  Social History   Tobacco Use  Smoking Status Never  Smokeless Tobacco Never    Social History   Substance and Sexual Activity  Alcohol Use Never     Allergies  Allergen Reactions   Lipitor [Atorvastatin  Calcium ] Other (See Comments)    Not listed   Oxycodone-Acetaminophen Nausea And Vomiting    Medications: Asprin: Yes Statin: Yes Beta Blocker: No Ace Inhibitor/ARB: Hyzaar Anti-Coagulation: No  Current Outpatient Medications  Medication Sig Dispense Refill   albuterol (VENTOLIN HFA) 108 (90 Base) MCG/ACT inhaler Inhale 2 puffs into the lungs every 6 (six) hours as needed for wheezing or shortness of breath.     amLODipine (NORVASC) 5 MG tablet Take 5 mg by mouth every evening.     aspirin  EC 81 MG tablet Take 1 tablet (81 mg total) by mouth daily. Swallow whole. 90 tablet 3   Cholecalciferol (VITAMIN D-3) 125 MCG (5000 UT) TABS Take 5,000 Units by mouth in the morning.     ezetimibe (ZETIA) 10 MG tablet Take 10 mg by mouth daily in the afternoon.     Garlic 300 MG CAPS Take 300 mg by mouth every evening.     hydrOXYzine (ATARAX) 10 MG tablet Take 10 mg by mouth every other day as needed (sleep).     losartan-hydrochlorothiazide (HYZAAR) 50-12.5 MG tablet Take 1 tablet by mouth in the morning.      meclizine (ANTIVERT) 25 MG tablet Take 25 mg by mouth 3 (three) times daily as needed for dizziness.     minocycline (MINOCIN) 100 MG capsule Take 100 mg by mouth daily as needed (skin blemishes).     Multiple Vitamin (MULTIVITAMIN) tablet Take 1 tablet by mouth in the morning.     rosuvastatin  (CRESTOR ) 20 MG tablet Take 1 tablet (20 mg total) by mouth daily. 30 tablet 2   tamsulosin (FLOMAX) 0.4 MG CAPS capsule Take 0.4 mg by mouth every 3 (three) days.     triamcinolone (KENALOG) 0.1 % Apply 1 application  topically 2 (two) times daily as needed (skin rash/irritation.).     No current facility-administered medications for this visit.    (Not  in a hospital admission)   Family History  Problem Relation Age of Onset   Diabetes Mother    Kidney disease Mother    Kidney Stones Mother    Kidney Stones Sister      Review of Systems:   Review of Systems  Constitutional:  Positive for malaise/fatigue. Negative for chills and fever.  Respiratory:  Positive for shortness of breath. Negative for cough.   Cardiovascular:  Positive for palpitations and orthopnea. Negative for chest pain and leg swelling.  Gastrointestinal:  Negative for nausea and vomiting.  Neurological:  Positive for dizziness. Negative for focal weakness.      Physical Exam: BP (!) 146/90 (BP Location: Right Arm, Patient Position: Sitting, Cuff Size: Normal)   Pulse 80   Resp 20   Ht 5' 5 (1.651 m)   Wt 154 lb 6.4 oz (70 kg)   SpO2 96% Comment: RA  BMI 25.69 kg/m  Physical Exam Constitutional:      General: He is not in acute distress.    Appearance: Normal appearance.  HENT:     Head: Normocephalic and atraumatic.  Cardiovascular:     Rate and Rhythm: Normal rate and regular rhythm.  Pulmonary:     Effort: Pulmonary effort is normal.     Breath sounds: Normal breath sounds.  Abdominal:     General: There is no distension.     Palpations: Abdomen is soft.  Musculoskeletal:     Comments: Mild leg  edema. Small varicose vein in upper right calf.  Left leg without varicose vein  Skin:    General: Skin is warm and dry.  Neurological:     General: No focal deficit present.     Mental Status: He is alert and oriented to person, place, and time.      Diagnostic Studies & Laboratory data: Cardiac Studies & Procedures   ______________________________________________________________________________________________ CARDIAC CATHETERIZATION  CARDIAC CATHETERIZATION 05/20/2024  Conclusion Images from the original result were not included. Cardiac Catheterization 05/20/24: Hemodynamic data: Right heart catheterization revealing normal cardiac output and index without evidence of intracardiac shunting.  PAPi was reduced at 0.9 however probably an error. LVEDP 17 mmHg, mildly elevated. Angiographic data: LV: Normal LV systolic function, EF 60%.  Normal size.  No significant mitral regurgitation. LM: It is calcified but widely patent. LAD: It gives origin to a very large D1.  There is a long segment calcific proximal to mid 80% stenosis.  D1 which is large has ostial 40% stenosis.  Distal LAD and D1 have minimal disease. LCx: Ostium has a 50% stenosis.  There is a high OM1 which is very small measuring about 1.5 mm however supplies large area with mild ostial 60 to 70% stenosis.  Large OM 2 with a mid 80% stenosis.  Moderate-sized OM 3. RCA: Mid and distal RCA are ectatic.  Gives origin to large PL branch with minimal disease and a large PDA with ostial 80% stenosis.    Impression and recommendations: Normal LVEF. Severe and complex proximal LAD stenosis involving a very large D1 which are good targets for CABG, high-grade large OM 2 stenosis and the PDA stenosis which are also good targets.  Patient would benefit from CABG.  Surgical referral was made.  Findings Coronary Findings Diagnostic  Dominance: Right  Left Anterior Descending Ost LAD to Mid LAD lesion is 80% stenosed.  First  Diagonal Branch 1st Diag lesion is 40% stenosed.  Left Circumflex Ost Cx to Prox Cx lesion is 50% stenosed.  First  Obtuse Marginal Branch Vessel is small in size.  Second Obtuse Marginal Branch 2nd Mrg lesion is 80% stenosed.  Third Obtuse Marginal Branch 3rd Mrg lesion is 20% stenosed.  Right Coronary Artery Prox RCA lesion is 20% stenosed. Non-stenotic Mid RCA to Dist RCA lesion.  Right Posterior Descending Artery RPDA lesion is 80% stenosed.  Intervention  No interventions have been documented.   STRESS TESTS  MYOCARDIAL PERFUSION IMAGING 02/19/2024  Interpretation Summary   The study is normal. The study is low risk.   2.5 mm of horizontal ST depression in the inferolateral leads (II, III, aVF, V5 and V6) was noted.   Left ventricular function is normal. Nuclear stress EF: 69%. The left ventricular ejection fraction is hyperdynamic (>65%). End diastolic cavity size is normal.   Please note abnormal EKG, but no chest pain, normal nuclear assessment.   ECHOCARDIOGRAM  ECHOCARDIOGRAM COMPLETE 05/26/2024  Narrative ECHOCARDIOGRAM REPORT    Patient Name:   Dustin Sloan Date of Exam: 05/26/2024 Medical Rec #:  [id number]     Height:       65.0 in Accession #:    [id number]    Weight:       150.0 lb Date of Birth:  [birth year]     BSA:          1.750 m Patient Age:    73 years      BP:           119/82 mmHg Patient Gender: M             HR:           77 bpm. Exam Location:  [place]  Procedure: 2D Echo, 3D Echo, Cardiac Doppler, Color Doppler and Strain Analysis (Both Spectral and Color Flow Doppler were utilized during procedure).  Indications:    Coronary artery disease of native artery of native heart with stable angina pectoris [I25.118]  History:        Patient has prior history of Echocardiogram examinations, most recent 07/08/2020. CAD, Signs/Symptoms:Shortness of Breath; Risk Factors:Hypertension and Dyslipidemia.  Sonographer:    Charlie Jointer  RDCS Referring Phys: [id number] ALEAN SAUNDERS MADIREDDY  IMPRESSIONS   1. Left ventricular ejection fraction, by estimation, is 60 to 65%. The left ventricle has normal function. The left ventricle has no regional wall motion abnormalities. Left ventricular diastolic parameters are consistent with Grade I diastolic dysfunction (impaired relaxation). The average left ventricular global longitudinal strain is -18.2 %. The global longitudinal strain is normal. 2. Right ventricular systolic function is normal. The right ventricular size is normal. There is normal pulmonary artery systolic pressure. 3. The mitral valve is normal in structure. No evidence of mitral valve regurgitation. No evidence of mitral stenosis. 4. The aortic valve is normal in structure. Aortic valve regurgitation is not visualized. No aortic stenosis is present. 5. The inferior vena cava is normal in size with greater than 50% respiratory variability, suggesting right atrial pressure of 3 mmHg.  FINDINGS Left Ventricle: Left ventricular ejection fraction, by estimation, is 60 to 65%. The left ventricle has normal function. The left ventricle has no regional wall motion abnormalities. The average left ventricular global longitudinal strain is -18.2 %. Strain was performed and the global longitudinal strain is normal. The left ventricular internal cavity size was normal in size. There is no left ventricular hypertrophy. Left ventricular diastolic parameters are consistent with Grade I diastolic dysfunction (impaired relaxation).  Right Ventricle: The right ventricular size is normal. No increase in right ventricular wall  thickness. Right ventricular systolic function is normal. There is normal pulmonary artery systolic pressure. The tricuspid regurgitant velocity is 2.10 m/s, and with an assumed right atrial pressure of 3 mmHg, the estimated right ventricular systolic pressure is 20.6 mmHg.  Left Atrium: Left atrial size was normal in  size.  Right Atrium: Right atrial size was normal in size.  Pericardium: There is no evidence of pericardial effusion.  Mitral Valve: The mitral valve is normal in structure. No evidence of mitral valve regurgitation. No evidence of mitral valve stenosis.  Tricuspid Valve: The tricuspid valve is normal in structure. Tricuspid valve regurgitation is not demonstrated. No evidence of tricuspid stenosis.  Aortic Valve: The aortic valve is normal in structure. Aortic valve regurgitation is not visualized. No aortic stenosis is present.  Pulmonic Valve: The pulmonic valve was normal in structure. Pulmonic valve regurgitation is not visualized. No evidence of pulmonic stenosis.  Aorta: The aortic root is normal in size and structure.  Venous: The inferior vena cava is normal in size with greater than 50% respiratory variability, suggesting right atrial pressure of 3 mmHg.  IAS/Shunts: No atrial level shunt detected by color flow Doppler.   LEFT VENTRICLE PLAX 2D LVIDd:         4.10 cm   Diastology LVIDs:         2.90 cm   LV e' medial:    7.34 cm/s LV PW:         1.10 cm   LV E/e' medial:  9.5 LV IVS:        1.10 cm   LV e' lateral:   9.65 cm/s LVOT diam:     2.20 cm   LV E/e' lateral: 7.3 LV SV:         70 LV SV Index:   40        2D Longitudinal Strain LVOT Area:     3.80 cm  2D Strain GLS Avg:     -18.2 %  3D Volume EF: 3D EF:        58 % LV EDV:       106 ml LV ESV:       45 ml LV SV:        61 ml  RIGHT VENTRICLE             IVC RV Basal diam:  3.00 cm     IVC diam: 1.20 cm RV Mid diam:    2.60 cm RV S prime:     13.47 cm/s TAPSE (M-mode): 2.5 cm  LEFT ATRIUM             Index        RIGHT ATRIUM           Index LA diam:        2.70 cm 1.54 cm/m   RA Area:     19.10 cm LA Vol (A2C):   41.2 ml 23.54 ml/m  RA Volume:   52.30 ml  29.88 ml/m LA Vol (A4C):   52.2 ml 29.82 ml/m LA Biplane Vol: 49.6 ml 28.34 ml/m AORTIC VALVE LVOT Vmax:   95.37 cm/s LVOT Vmean:   62.367 cm/s LVOT VTI:    0.184 m  AORTA Ao Root diam: 3.60 cm Ao Asc diam:  3.80 cm Ao Desc diam: 2.20 cm  MITRAL VALVE               TRICUSPID VALVE MV Area (PHT): 4.26 cm    TR Peak grad:  17.6 mmHg MV Decel Time: 178 msec    TR Vmax:        210.00 cm/s MV E velocity: 69.95 cm/s MV A velocity: 87.55 cm/s  SHUNTS MV E/A ratio:  0.80        Systemic VTI:  0.18 m Systemic Diam: 2.20 cm  Jennifer Crape MD Electronically signed by Jennifer Crape MD Signature Date/Time: 05/26/2024/3:23:06 PM    Final    MONITORS  LONG TERM MONITOR (3-14 DAYS) 09/09/2020  Narrative The patient wore the monitor for 14 days starting 08/23/2020 . Indication: Dizziness  The minimum heart rate was 52  bpm, maximum heart rate was 150  bpm, and average heart rate was   bpm. Predominant underlying rhythm was Sinus Rhythm. First Degree AV Block was present  Premature atrial complexes were rare. Premature Ventricular complexes were rare.  No ventricular tachycardia, no pauses, no AV block, no supraventricular and no atrial fibrillation present. No patient triggered event or diary event noted.  Conclusion: Normal/Unremarkable study, with no evidence of significant arrhythmia.   CT SCANS  CT CORONARY MORPH W/CTA COR W/SCORE 04/28/2024  Addendum 04/30/2024  3:27 PM ADDENDUM REPORT: 04/30/2024 15:24  EXAM: OVER-READ INTERPRETATION CT CHEST  The following report is an over-read performed by radiologist Dr. Rogelia Myers of [facility] on 04/30/2024. This over-read does not include interpretation of cardiac or coronary anatomy or pathology. The coronary CTA interpretation by the cardiologist is attached.  COMPARISON:  02/01/2024  FINDINGS: Cardiovascular: Normal appearance of extracardiac vascular structures. No pericardial effusion. No aortic aneurysm.  Mediastinum/Nodes: No mediastinal mass. No mediastinal or hilar lymphadenopathy. Normal esophagus.  Lungs/Pleura: The midline  trachea and bronchi are patent. No focal airspace consolidation, pleural effusion, or pneumothorax. Posterior bibasilar dependent atelectasis.  Musculoskeletal: No acute fracture or destructive bone lesion. Thoracic DISH.  Upper Abdomen: No acute abnormality in the partially visualized upper abdomen.  IMPRESSION: No acute abnormality within the visualized chest. No pneumonia, pulmonary edema, or pleural effusion.   Electronically Signed By: Rogelia Myers M.D. On: 04/30/2024 15:24  Narrative CLINICAL DATA:  CP  EXAM: Cardiac/Coronary  CTA  TECHNIQUE: The patient was scanned on a GE Apex scanner.  FINDINGS: A 120 kV prospective scan was triggered in the descending thoracic aorta at 111 HU's. Axial non-contrast 3 mm slices were carried out through the heart. The data set was analyzed on a dedicated work station and scored using the Agatson method. Gantry rotation speed was 250 msecs and collimation was .6 mm. No beta blockade and 0.8 mg of sl NTG was given. The 3D data set was reconstructed at 41% of the R-R cycle. Diastolic phases were analyzed on a dedicated work station using MPR, MIP and VRT modes. The patient received 80 cc of contrast.  Aorta: Normal size.  No calcifications.  No dissection.  Aortic Valve:  Trileaflet.  No calcifications.  Coronary Arteries:  Normal coronary origin.  Right dominance.  RCA is a large dominant artery that gives rise to PDA and PLA. This artery is diffusely diseased with mixed plaque in its proximal portion with moderate stenosis of 50-69%. PLB has mixed plaque with moderate stenosis of 50-69%  Left main is a large artery that gives rise to LAD and LCX arteries.  LAD is a large vessel that is diffusely diseased with numerous mixed plaques. In the proximal portion stenosis of moderate degree 50-69% is noted, in the distal portion here is soft plaque with severe stenosis of 70-99%. This artery gives rise to  large D1. D1 is  also diffusely diseased with numerous plaques. Mostly calcified but few soft plaques are noted as well. At least moderate stenosis of 50-69% is suspected in the proximal and mid portion of this large D1  LCX is a non-dominant artery that gives rise to one large OM1 branch. This artery is diffusely diseased with numerous mixed plaques. Moderate stenosis 50-69% is suspected at different levels in both CX and OM1.  Other findings:  Normal pulmonary vein drainage into the left atrium.  Normal left atrial appendage without a thrombus.  Normal size of the pulmonary artery.  IMPRESSION: 1. Coronary calcium  score of 3227. This was 55 percentile for age and sex matched control.  2. Normal coronary origin with right dominance.  3. CAD-RADS 4 Severe stenosis. (70-99%) distal LAD. Numerous moderate stenoses noted. Cardiac catheterization or CT FFR is recommended. Consider symptom-guided anti-ischemic pharmacotherapy as well as risk factor modification per guideline directed care.  Electronically Signed: By: Lamar Fitch M.D. On: 04/29/2024 17:24     ______________________________________________________________________________________________     EKG: NSR with 1st degree AV block  I have independently reviewed the above radiologic studies and discussed with the patient   Recent Lab Findings: Lab Results  Component Value Date   WBC 4.1 05/08/2024   HGB 13.9 05/20/2024   HCT 41.0 05/20/2024   PLT 142 (L) 05/08/2024   GLUCOSE 89 05/08/2024   CHOL 198 08/23/2020   TRIG 192 (H) 08/23/2020   HDL 34 (L) 08/23/2020   LDLCALC 130 (H) 08/23/2020   ALT 20 02/14/2024   AST 20 02/14/2024   NA 142 05/20/2024   K 3.8 05/20/2024   CL 103 05/08/2024   CREATININE 0.99 05/08/2024   BUN 12 05/08/2024   CO2 25 05/08/2024   TSH 3.200 08/23/2020      Assessment / Plan:   Mr. Ferris is a 73 year old man with history of BPH, kidney stone, HTN and HL who presents with  progressively worsening shortness of breath and decrease exercise tolerance.  He also reports some chest discomfort that comes and goes.  Work up has demonstrated multi-vessel CAD with preserved EF and no valvular disease.  He is very functional and a good candidate for CABG.  I discussed the general nature of the procedure, including the need for general anesthesia, the incisions to be used, the use of cardiopulmonary bypass, and the use of temporary pacemaker wires and drainage tubes postoperatively with Mr. Rinehart and his wife.  We discussed the expected hospital stay, overall recovery and short and long term outcomes. I informed him of the indications, risks, benefits and alternatives.   He understands the risks include, but are not limited to death, stroke, MI, DVT/PE, bleeding, possible need for transfusion, infections, cardiac arrhythmias, as well as other organ system dysfunction including respiratory (eg: prolonged ventilation), renal, or GI complications.   Patient is in agreement to proceed with surgery.  Plan: Proceed with CABG on 10/14.   I  spent 45 minutes counseling the patient face to face.  Con RAMAN Sereena Marando 05/28/2024 1:41 PM

## 2024-05-28 NOTE — H&P (View-Only) (Signed)
 [address] [place] [address]             [phone]    [name] [facility] Medical Record #[id number] Date of Birth: Dec 26, 1950  Referring: Ladona Heinz, MD Primary Care: Duwaine Burnard Amble, NP Primary Cardiologist:None  Chief Complaint:    Chief Complaint  Patient presents with   Coronary Artery Disease    New patient consultation, CATH 10/1, ECHO 10/7    History of Present Illness:     Dustin Sloan is a 73 y.o. male who presents for surgical evaluation of multivessel coronary artery disease.  The patient is very anxious and has reports progressive fatigue and shortness of breath that has worsened.  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  He reports chest pain that feels like a tremble but that it comes and goes.  He has never had a stroke but has a son who has had 2 strokes.  He reports orthopnea (although likely anxiety related) and sometimes gets dizzy.  He has also had a lot of kidney stones in the past.  He is a never smoker, does not use alcohol.  Reports that he has been difficult to put to sleep in the past and was awake for arm surgery and colonoscopy.  LHC (05/20/24): LAD, OM2 and PDA disease. TTE (05/26/24): LVEF 60-65%, normal RV function. No AI, no AS; no MR, no MS CT chest (04/29/24): No root or ascending aortic calcium   Past Medical and Surgical History: Previous Chest Surgery: No Previous Chest Radiation: No Diabetes Mellitus: No.  HbA1C N/A Anticoagulation: No, Last dose N/A  Creatinine:  Lab Results  Component Value Date   CREATININE 0.99 05/08/2024   CREATININE 1.00 02/14/2024   CREATININE 0.95 08/23/2020     Past Medical History:  Diagnosis Date   Abnormal urine 07/22/2023   Unspecified abnormal findings in urine     Allergies    Arthritis    Asymptomatic varicose veins of both lower extremities 08/23/2020   Benign prostatic hyperplasia    Blood in urine  08/08/2021   Hematuria, unspecifiedHematuria, unspecified; Start Date : 10/24/2022Hematuria, unspecified; Start Date : 03/14/2021     Body mass index (BMI) 24.0-24.9, adult 12/27/2021   Body mass index [BMI] 24.0-24.9, adultBody mass index [BMI] 24.0-24.9, adult; Start Date : 02/07/2023Body mass index [BMI] 24.0-24.9, adult; Start Date : 12/20/2022Body mass index [BMI] 24.0-24.9, adult; Start Date : 06/12/2021     Body mass index (BMI) 27.0-27.9, adult 02/24/2019   Body mass index [BMI] 27.0-27.9, adult     Capsulitis of metatarsophalangeal (MTP) joint of right foot 04/14/2020   Chest pain 08/23/2020   Disequilibrium 09/26/2021   Dizziness and giddinessDizziness and giddiness; Start Date : 04/22/2022Dizziness and giddiness; Start Date : 01/26/2022Dizziness and giddiness; Start Date : 12/17/2021Dizziness and giddiness; Start Date : 07/05/2020     Dizziness    Electrocardiogram abnormal 03/29/2022   Abnormal electrocardiogram [ECG] [EKG]     Encounter for examination of eyes and vision without abnormal findings 03/29/2022   Encounter for exam of eyes and vision w/o abnormal findingsEncounter for exam of eyes and vision w/o abnormal findings; Start Date : 07/26/2022Encounter for exam of eyes and vision w/o abnormal findings; Start Date : 04/29/2021Encounter for exam of eyes and vision w/o abnormal findings; Start Date : 02/24/2019     Encounter for screening for cardiovascular disorders 07/22/2023   Encounter for  screening for cardiovascular disordersEncounter for screening for cardiovascular disorders; Start Date : 03/29/2022     Essential hypertension    Fatigue    Hyperlipidemia    Hypertension    Impaired cognition    Inflammatory dermatosis    Kidney stone    Low testosterone    Other specified health status 04/11/2023   Other specified health status     Pain in left foot 12/17/2019   Pain in left foot     Pain in right foot 12/17/2019   Pain in right foot     Shortness  of breath    Spasm 07/09/2022   Cramp and spasmCramp and spasm; Start Date : 02/24/2019     Testicular hypofunction    Tight heel cords, acquired, bilateral 04/15/2020   Unstable angina pectoris (HCC) 08/23/2020   Vitamin D deficiency     Past Surgical History:  Procedure Laterality Date   APPENDECTOMY     2018   CATARACT EXTRACTION W/ INTRAOCULAR LENS IMPLANT Right 06/18/2023   LITHOTRIPSY     NECK SURGERY     1988, 2008   RIGHT/LEFT HEART CATH AND CORONARY ANGIOGRAPHY N/A 05/20/2024   Procedure: RIGHT/LEFT HEART CATH AND CORONARY ANGIOGRAPHY;  Surgeon: Ladona Heinz, MD;  Location: [facility];  Service: Cardiovascular;  Laterality: N/A;    Social History:  Social History   Tobacco Use  Smoking Status Never  Smokeless Tobacco Never    Social History   Substance and Sexual Activity  Alcohol Use Never     Allergies  Allergen Reactions   Lipitor [Atorvastatin  Calcium ] Other (See Comments)    Not listed   Oxycodone-Acetaminophen Nausea And Vomiting    Medications: Asprin: Yes Statin: Yes Beta Blocker: No Ace Inhibitor/ARB: Hyzaar Anti-Coagulation: No  Current Outpatient Medications  Medication Sig Dispense Refill   albuterol (VENTOLIN HFA) 108 (90 Base) MCG/ACT inhaler Inhale 2 puffs into the lungs every 6 (six) hours as needed for wheezing or shortness of breath.     amLODipine (NORVASC) 5 MG tablet Take 5 mg by mouth every evening.     aspirin  EC 81 MG tablet Take 1 tablet (81 mg total) by mouth daily. Swallow whole. 90 tablet 3   Cholecalciferol (VITAMIN D-3) 125 MCG (5000 UT) TABS Take 5,000 Units by mouth in the morning.     ezetimibe (ZETIA) 10 MG tablet Take 10 mg by mouth daily in the afternoon.     Garlic 300 MG CAPS Take 300 mg by mouth every evening.     hydrOXYzine (ATARAX) 10 MG tablet Take 10 mg by mouth every other day as needed (sleep).     losartan-hydrochlorothiazide (HYZAAR) 50-12.5 MG tablet Take 1 tablet by mouth in the morning.      meclizine (ANTIVERT) 25 MG tablet Take 25 mg by mouth 3 (three) times daily as needed for dizziness.     minocycline (MINOCIN) 100 MG capsule Take 100 mg by mouth daily as needed (skin blemishes).     Multiple Vitamin (MULTIVITAMIN) tablet Take 1 tablet by mouth in the morning.     rosuvastatin  (CRESTOR ) 20 MG tablet Take 1 tablet (20 mg total) by mouth daily. 30 tablet 2   tamsulosin (FLOMAX) 0.4 MG CAPS capsule Take 0.4 mg by mouth every 3 (three) days.     triamcinolone (KENALOG) 0.1 % Apply 1 application  topically 2 (two) times daily as needed (skin rash/irritation.).     No current facility-administered medications for this visit.    (Not  in a hospital admission)   Family History  Problem Relation Age of Onset   Diabetes Mother    Kidney disease Mother    Kidney Stones Mother    Kidney Stones Sister      Review of Systems:   Review of Systems  Constitutional:  Positive for malaise/fatigue. Negative for chills and fever.  Respiratory:  Positive for shortness of breath. Negative for cough.   Cardiovascular:  Positive for palpitations and orthopnea. Negative for chest pain and leg swelling.  Gastrointestinal:  Negative for nausea and vomiting.  Neurological:  Positive for dizziness. Negative for focal weakness.      Physical Exam: BP (!) 146/90 (BP Location: Right Arm, Patient Position: Sitting, Cuff Size: Normal)   Pulse 80   Resp 20   Ht 5' 5 (1.651 m)   Wt 154 lb 6.4 oz (70 kg)   SpO2 96% Comment: RA  BMI 25.69 kg/m  Physical Exam Constitutional:      General: He is not in acute distress.    Appearance: Normal appearance.  HENT:     Head: Normocephalic and atraumatic.  Cardiovascular:     Rate and Rhythm: Normal rate and regular rhythm.  Pulmonary:     Effort: Pulmonary effort is normal.     Breath sounds: Normal breath sounds.  Abdominal:     General: There is no distension.     Palpations: Abdomen is soft.  Musculoskeletal:     Comments: Mild leg  edema. Small varicose vein in upper right calf.  Left leg without varicose vein  Skin:    General: Skin is warm and dry.  Neurological:     General: No focal deficit present.     Mental Status: He is alert and oriented to person, place, and time.      Diagnostic Studies & Laboratory data: Cardiac Studies & Procedures   ______________________________________________________________________________________________ CARDIAC CATHETERIZATION  CARDIAC CATHETERIZATION 05/20/2024  Conclusion Images from the original result were not included. Cardiac Catheterization 05/20/24: Hemodynamic data: Right heart catheterization revealing normal cardiac output and index without evidence of intracardiac shunting.  PAPi was reduced at 0.9 however probably an error. LVEDP 17 mmHg, mildly elevated. Angiographic data: LV: Normal LV systolic function, EF 60%.  Normal size.  No significant mitral regurgitation. LM: It is calcified but widely patent. LAD: It gives origin to a very large D1.  There is a long segment calcific proximal to mid 80% stenosis.  D1 which is large has ostial 40% stenosis.  Distal LAD and D1 have minimal disease. LCx: Ostium has a 50% stenosis.  There is a high OM1 which is very small measuring about 1.5 mm however supplies large area with mild ostial 60 to 70% stenosis.  Large OM 2 with a mid 80% stenosis.  Moderate-sized OM 3. RCA: Mid and distal RCA are ectatic.  Gives origin to large PL branch with minimal disease and a large PDA with ostial 80% stenosis.    Impression and recommendations: Normal LVEF. Severe and complex proximal LAD stenosis involving a very large D1 which are good targets for CABG, high-grade large OM 2 stenosis and the PDA stenosis which are also good targets.  Patient would benefit from CABG.  Surgical referral was made.  Findings Coronary Findings Diagnostic  Dominance: Right  Left Anterior Descending Ost LAD to Mid LAD lesion is 80% stenosed.  First  Diagonal Branch 1st Diag lesion is 40% stenosed.  Left Circumflex Ost Cx to Prox Cx lesion is 50% stenosed.  First  Obtuse Marginal Branch Vessel is small in size.  Second Obtuse Marginal Branch 2nd Mrg lesion is 80% stenosed.  Third Obtuse Marginal Branch 3rd Mrg lesion is 20% stenosed.  Right Coronary Artery Prox RCA lesion is 20% stenosed. Non-stenotic Mid RCA to Dist RCA lesion.  Right Posterior Descending Artery RPDA lesion is 80% stenosed.  Intervention  No interventions have been documented.   STRESS TESTS  MYOCARDIAL PERFUSION IMAGING 02/19/2024  Interpretation Summary   The study is normal. The study is low risk.   2.5 mm of horizontal ST depression in the inferolateral leads (II, III, aVF, V5 and V6) was noted.   Left ventricular function is normal. Nuclear stress EF: 69%. The left ventricular ejection fraction is hyperdynamic (>65%). End diastolic cavity size is normal.   Please note abnormal EKG, but no chest pain, normal nuclear assessment.   ECHOCARDIOGRAM  ECHOCARDIOGRAM COMPLETE 05/26/2024  Narrative ECHOCARDIOGRAM REPORT    Patient Name:   Dustin Sloan Date of Exam: 05/26/2024 Medical Rec #:  [id number]     Height:       65.0 in Accession #:    [id number]    Weight:       150.0 lb Date of Birth:  [birth year]     BSA:          1.750 m Patient Age:    73 years      BP:           119/82 mmHg Patient Gender: M             HR:           77 bpm. Exam Location:  [place]  Procedure: 2D Echo, 3D Echo, Cardiac Doppler, Color Doppler and Strain Analysis (Both Spectral and Color Flow Doppler were utilized during procedure).  Indications:    Coronary artery disease of native artery of native heart with stable angina pectoris [I25.118]  History:        Patient has prior history of Echocardiogram examinations, most recent 07/08/2020. CAD, Signs/Symptoms:Shortness of Breath; Risk Factors:Hypertension and Dyslipidemia.  Sonographer:    Charlie Jointer  RDCS Referring Phys: [id number] ALEAN SAUNDERS MADIREDDY  IMPRESSIONS   1. Left ventricular ejection fraction, by estimation, is 60 to 65%. The left ventricle has normal function. The left ventricle has no regional wall motion abnormalities. Left ventricular diastolic parameters are consistent with Grade I diastolic dysfunction (impaired relaxation). The average left ventricular global longitudinal strain is -18.2 %. The global longitudinal strain is normal. 2. Right ventricular systolic function is normal. The right ventricular size is normal. There is normal pulmonary artery systolic pressure. 3. The mitral valve is normal in structure. No evidence of mitral valve regurgitation. No evidence of mitral stenosis. 4. The aortic valve is normal in structure. Aortic valve regurgitation is not visualized. No aortic stenosis is present. 5. The inferior vena cava is normal in size with greater than 50% respiratory variability, suggesting right atrial pressure of 3 mmHg.  FINDINGS Left Ventricle: Left ventricular ejection fraction, by estimation, is 60 to 65%. The left ventricle has normal function. The left ventricle has no regional wall motion abnormalities. The average left ventricular global longitudinal strain is -18.2 %. Strain was performed and the global longitudinal strain is normal. The left ventricular internal cavity size was normal in size. There is no left ventricular hypertrophy. Left ventricular diastolic parameters are consistent with Grade I diastolic dysfunction (impaired relaxation).  Right Ventricle: The right ventricular size is normal. No increase in right ventricular wall  thickness. Right ventricular systolic function is normal. There is normal pulmonary artery systolic pressure. The tricuspid regurgitant velocity is 2.10 m/s, and with an assumed right atrial pressure of 3 mmHg, the estimated right ventricular systolic pressure is 20.6 mmHg.  Left Atrium: Left atrial size was normal in  size.  Right Atrium: Right atrial size was normal in size.  Pericardium: There is no evidence of pericardial effusion.  Mitral Valve: The mitral valve is normal in structure. No evidence of mitral valve regurgitation. No evidence of mitral valve stenosis.  Tricuspid Valve: The tricuspid valve is normal in structure. Tricuspid valve regurgitation is not demonstrated. No evidence of tricuspid stenosis.  Aortic Valve: The aortic valve is normal in structure. Aortic valve regurgitation is not visualized. No aortic stenosis is present.  Pulmonic Valve: The pulmonic valve was normal in structure. Pulmonic valve regurgitation is not visualized. No evidence of pulmonic stenosis.  Aorta: The aortic root is normal in size and structure.  Venous: The inferior vena cava is normal in size with greater than 50% respiratory variability, suggesting right atrial pressure of 3 mmHg.  IAS/Shunts: No atrial level shunt detected by color flow Doppler.   LEFT VENTRICLE PLAX 2D LVIDd:         4.10 cm   Diastology LVIDs:         2.90 cm   LV e' medial:    7.34 cm/s LV PW:         1.10 cm   LV E/e' medial:  9.5 LV IVS:        1.10 cm   LV e' lateral:   9.65 cm/s LVOT diam:     2.20 cm   LV E/e' lateral: 7.3 LV SV:         70 LV SV Index:   40        2D Longitudinal Strain LVOT Area:     3.80 cm  2D Strain GLS Avg:     -18.2 %  3D Volume EF: 3D EF:        58 % LV EDV:       106 ml LV ESV:       45 ml LV SV:        61 ml  RIGHT VENTRICLE             IVC RV Basal diam:  3.00 cm     IVC diam: 1.20 cm RV Mid diam:    2.60 cm RV S prime:     13.47 cm/s TAPSE (M-mode): 2.5 cm  LEFT ATRIUM             Index        RIGHT ATRIUM           Index LA diam:        2.70 cm 1.54 cm/m   RA Area:     19.10 cm LA Vol (A2C):   41.2 ml 23.54 ml/m  RA Volume:   52.30 ml  29.88 ml/m LA Vol (A4C):   52.2 ml 29.82 ml/m LA Biplane Vol: 49.6 ml 28.34 ml/m AORTIC VALVE LVOT Vmax:   95.37 cm/s LVOT Vmean:   62.367 cm/s LVOT VTI:    0.184 m  AORTA Ao Root diam: 3.60 cm Ao Asc diam:  3.80 cm Ao Desc diam: 2.20 cm  MITRAL VALVE               TRICUSPID VALVE MV Area (PHT): 4.26 cm    TR Peak grad:  17.6 mmHg MV Decel Time: 178 msec    TR Vmax:        210.00 cm/s MV E velocity: 69.95 cm/s MV A velocity: 87.55 cm/s  SHUNTS MV E/A ratio:  0.80        Systemic VTI:  0.18 m Systemic Diam: 2.20 cm  Jennifer Crape MD Electronically signed by Jennifer Crape MD Signature Date/Time: 05/26/2024/3:23:06 PM    Final    MONITORS  LONG TERM MONITOR (3-14 DAYS) 09/09/2020  Narrative The patient wore the monitor for 14 days starting 08/23/2020 . Indication: Dizziness  The minimum heart rate was 52  bpm, maximum heart rate was 150  bpm, and average heart rate was   bpm. Predominant underlying rhythm was Sinus Rhythm. First Degree AV Block was present  Premature atrial complexes were rare. Premature Ventricular complexes were rare.  No ventricular tachycardia, no pauses, no AV block, no supraventricular and no atrial fibrillation present. No patient triggered event or diary event noted.  Conclusion: Normal/Unremarkable study, with no evidence of significant arrhythmia.   CT SCANS  CT CORONARY MORPH W/CTA COR W/SCORE 04/28/2024  Addendum 04/30/2024  3:27 PM ADDENDUM REPORT: 04/30/2024 15:24  EXAM: OVER-READ INTERPRETATION CT CHEST  The following report is an over-read performed by radiologist Dr. Rogelia Myers of [facility] on 04/30/2024. This over-read does not include interpretation of cardiac or coronary anatomy or pathology. The coronary CTA interpretation by the cardiologist is attached.  COMPARISON:  02/01/2024  FINDINGS: Cardiovascular: Normal appearance of extracardiac vascular structures. No pericardial effusion. No aortic aneurysm.  Mediastinum/Nodes: No mediastinal mass. No mediastinal or hilar lymphadenopathy. Normal esophagus.  Lungs/Pleura: The midline  trachea and bronchi are patent. No focal airspace consolidation, pleural effusion, or pneumothorax. Posterior bibasilar dependent atelectasis.  Musculoskeletal: No acute fracture or destructive bone lesion. Thoracic DISH.  Upper Abdomen: No acute abnormality in the partially visualized upper abdomen.  IMPRESSION: No acute abnormality within the visualized chest. No pneumonia, pulmonary edema, or pleural effusion.   Electronically Signed By: Rogelia Myers M.D. On: 04/30/2024 15:24  Narrative CLINICAL DATA:  CP  EXAM: Cardiac/Coronary  CTA  TECHNIQUE: The patient was scanned on a GE Apex scanner.  FINDINGS: A 120 kV prospective scan was triggered in the descending thoracic aorta at 111 HU's. Axial non-contrast 3 mm slices were carried out through the heart. The data set was analyzed on a dedicated work station and scored using the Agatson method. Gantry rotation speed was 250 msecs and collimation was .6 mm. No beta blockade and 0.8 mg of sl NTG was given. The 3D data set was reconstructed at 41% of the R-R cycle. Diastolic phases were analyzed on a dedicated work station using MPR, MIP and VRT modes. The patient received 80 cc of contrast.  Aorta: Normal size.  No calcifications.  No dissection.  Aortic Valve:  Trileaflet.  No calcifications.  Coronary Arteries:  Normal coronary origin.  Right dominance.  RCA is a large dominant artery that gives rise to PDA and PLA. This artery is diffusely diseased with mixed plaque in its proximal portion with moderate stenosis of 50-69%. PLB has mixed plaque with moderate stenosis of 50-69%  Left main is a large artery that gives rise to LAD and LCX arteries.  LAD is a large vessel that is diffusely diseased with numerous mixed plaques. In the proximal portion stenosis of moderate degree 50-69% is noted, in the distal portion here is soft plaque with severe stenosis of 70-99%. This artery gives rise to  large D1. D1 is  also diffusely diseased with numerous plaques. Mostly calcified but few soft plaques are noted as well. At least moderate stenosis of 50-69% is suspected in the proximal and mid portion of this large D1  LCX is a non-dominant artery that gives rise to one large OM1 branch. This artery is diffusely diseased with numerous mixed plaques. Moderate stenosis 50-69% is suspected at different levels in both CX and OM1.  Other findings:  Normal pulmonary vein drainage into the left atrium.  Normal left atrial appendage without a thrombus.  Normal size of the pulmonary artery.  IMPRESSION: 1. Coronary calcium  score of 3227. This was 55 percentile for age and sex matched control.  2. Normal coronary origin with right dominance.  3. CAD-RADS 4 Severe stenosis. (70-99%) distal LAD. Numerous moderate stenoses noted. Cardiac catheterization or CT FFR is recommended. Consider symptom-guided anti-ischemic pharmacotherapy as well as risk factor modification per guideline directed care.  Electronically Signed: By: Lamar Fitch M.D. On: 04/29/2024 17:24     ______________________________________________________________________________________________     EKG: NSR with 1st degree AV block  I have independently reviewed the above radiologic studies and discussed with the patient   Recent Lab Findings: Lab Results  Component Value Date   WBC 4.1 05/08/2024   HGB 13.9 05/20/2024   HCT 41.0 05/20/2024   PLT 142 (L) 05/08/2024   GLUCOSE 89 05/08/2024   CHOL 198 08/23/2020   TRIG 192 (H) 08/23/2020   HDL 34 (L) 08/23/2020   LDLCALC 130 (H) 08/23/2020   ALT 20 02/14/2024   AST 20 02/14/2024   NA 142 05/20/2024   K 3.8 05/20/2024   CL 103 05/08/2024   CREATININE 0.99 05/08/2024   BUN 12 05/08/2024   CO2 25 05/08/2024   TSH 3.200 08/23/2020      Assessment / Plan:   Mr. Ferris is a 73 year old man with history of BPH, kidney stone, HTN and HL who presents with  progressively worsening shortness of breath and decrease exercise tolerance.  He also reports some chest discomfort that comes and goes.  Work up has demonstrated multi-vessel CAD with preserved EF and no valvular disease.  He is very functional and a good candidate for CABG.  I discussed the general nature of the procedure, including the need for general anesthesia, the incisions to be used, the use of cardiopulmonary bypass, and the use of temporary pacemaker wires and drainage tubes postoperatively with Dustin Sloan and his wife.  We discussed the expected hospital stay, overall recovery and short and long term outcomes. I informed him of the indications, risks, benefits and alternatives.   He understands the risks include, but are not limited to death, stroke, MI, DVT/PE, bleeding, possible need for transfusion, infections, cardiac arrhythmias, as well as other organ system dysfunction including respiratory (eg: prolonged ventilation), renal, or GI complications.   Patient is in agreement to proceed with surgery.  Plan: Proceed with CABG on 10/14.   I  spent 45 minutes counseling the patient face to face.  Con RAMAN Sereena Marando 05/28/2024 1:41 PM

## 2024-05-29 ENCOUNTER — Encounter (HOSPITAL_COMMUNITY): Payer: Self-pay

## 2024-05-29 NOTE — Progress Notes (Signed)
 PCP - [name], FNP-C Cardiologist -  Dr Alean Madireddy  Chest x-ray - 06/01/24 EKG - 05/07/24 Stress Test - 02/19/24 ECHO - 05/26/24 Cardiac Cath - 05/20/24 Dopplers - 06/01/24 after PAT appt.  ICD Pacemaker/Loop - n/a  Sleep Study -  Yes CPAP- does not use CPAP  Diabetes - n/a  Blood Thinner Instructions:  n/a  Aspirin  Instructions: Continue ASA but not on DOS.  NPO  Anesthesia review: Yes  STOP now taking any Aspirin  (unless otherwise instructed by your surgeon), Aleve, Naproxen, Ibuprofen, Motrin, Advil, Goody's, BC's, all herbal medications, fish oil, and all vitamins.   Coronavirus Screening Do you have any of the following symptoms:  Cough yes/no: No Fever (>100.5F)  yes/no: No Runny nose yes/no: No Sore throat yes/no: No Difficulty breathing/shortness of breath  yes  Have you traveled in the last 14 days and where? yes/no: No  Patient verbalized understanding of instructions that were given to them at the PAT appointment. Patient was also instructed that they will need to review over the PAT instructions again at home before surgery.

## 2024-05-29 NOTE — Progress Notes (Signed)
 Surgical Instructions   Your procedure is scheduled on Tuesday June 02, 2024. Report to [facility] Main Entrance A at 5:30 A.M., then check in with the Admitting office. Any questions or running late day of surgery: call [phone]  Questions prior to your surgery date: call [phone], Monday-Friday, 8am-4pm. If you experience any cold or flu symptoms such as cough, fever, chills, shortness of breath, etc. between now and your scheduled surgery, please notify us  at the above number.     Remember:  Do not eat or drink after midnight the night before your surgery  Take these medicines the morning of surgery with A SIP OF WATER  amLODipine (NORVASC)  rosuvastatin  (CRESTOR )  tamsulosin (FLOMAX)   May take these medicines IF NEEDED: albuterol (VENTOLIN HFA) 108 (90 Base) MCG/ACT inhaler Please bring with you to the hospital meclizine (ANTIVERT)    DO NOT TAKE YOUR ASPIRIN  THE MORNING OF SURGERY.   One week prior to surgery, STOP taking any Aleve, Naproxen, Ibuprofen, Motrin, Advil, Goody's, BC's, all herbal medications, fish oil, and non-prescription vitamins.                     Do NOT Smoke (Tobacco/Vaping) for 24 hours prior to your procedure.  If you use a CPAP at night, you may bring your mask/headgear for your overnight stay.   You will be asked to remove any contacts, glasses, piercing's, hearing aid's, dentures/partials prior to surgery. Please bring cases for these items if needed.    Patients discharged the day of surgery will not be allowed to drive home, and someone needs to stay with them for 24 hours.  SURGICAL WAITING ROOM VISITATION Patients may have no more than 2 support people in the waiting area - these visitors may rotate.   Pre-op nurse will coordinate an appropriate time for 1 ADULT support person, who may not rotate, to accompany patient in pre-op.  Children under the age of 76 must have an adult with them who is not the patient and must remain in  the main waiting area with an adult.  If the patient needs to stay at the hospital during part of their recovery, the visitor guidelines for inpatient rooms apply.  Please refer to the [facility] website for the visitor guidelines for any additional information.   If you received a COVID test during your pre-op visit  it is requested that you wear a mask when out in public, stay away from anyone that may not be feeling well and notify your surgeon if you develop symptoms. If you have been in contact with anyone that has tested positive in the last 10 days please notify you surgeon.      Pre-operative CHG Bathing Instructions   You can play a key role in reducing the risk of infection after surgery. Your skin needs to be as free of germs as possible. You can reduce the number of germs on your skin by washing with CHG (chlorhexidine gluconate) soap before surgery. CHG is an antiseptic soap that kills germs and continues to kill germs even after washing.   DO NOT use if you have an allergy to chlorhexidine/CHG or antibacterial soaps. If your skin becomes reddened or irritated, stop using the CHG and notify one of our RNs at [phone].              TAKE A SHOWER THE NIGHT BEFORE SURGERY   Please keep in mind the following:  You may shave your face  before/day of surgery.  Place clean sheets on your bed the night before surgery Use a clean washcloth (not used since being washed) for shower. DO NOT sleep with pet's night before surgery.  CHG Shower Instructions:  Wash your face and private area with normal soap. If you choose to wash your hair, wash first with your normal shampoo.  After you use shampoo/soap, rinse your hair and body thoroughly to remove shampoo/soap residue.  Turn the water OFF and apply half the bottle of CHG soap to a CLEAN washcloth.  Apply CHG soap ONLY FROM YOUR NECK DOWN TO YOUR TOES (washing for 3-5 minutes)  DO NOT use CHG soap on face, private areas, open  wounds, or sores.  Pay special attention to the area where your surgery is being performed.  If you are having back surgery, having someone wash your back for you may be helpful. Wait 2 minutes after CHG soap is applied, then you may rinse off the CHG soap.  Pat dry with a clean towel  Put on clean pajamas    Additional instructions for the day of surgery: If you choose, you may shower the morning of surgery with an antibacterial soap.  DO NOT APPLY any lotions, deodorants or cologne.   Do not wear jewelry  Do not bring valuables to the hospital. [facility] is not responsible for valuables/personal belongings. Put on clean/comfortable clothes.  Please brush your teeth.  Ask your nurse before applying any prescription medications to the skin.

## 2024-06-01 ENCOUNTER — Ambulatory Visit (HOSPITAL_BASED_OUTPATIENT_CLINIC_OR_DEPARTMENT_OTHER)
Admission: RE | Admit: 2024-06-01 | Discharge: 2024-06-01 | Disposition: A | Discharge status: Home / Self Care | Source: Ambulatory Visit

## 2024-06-01 ENCOUNTER — Encounter (HOSPITAL_COMMUNITY): Payer: Self-pay

## 2024-06-01 ENCOUNTER — Other Ambulatory Visit: Payer: Self-pay

## 2024-06-01 ENCOUNTER — Encounter (HOSPITAL_COMMUNITY)
Admission: RE | Admit: 2024-06-01 | Discharge: 2024-06-01 | Disposition: A | Discharge status: Home / Self Care | Source: Ambulatory Visit

## 2024-06-01 ENCOUNTER — Ambulatory Visit (HOSPITAL_COMMUNITY): Admission: RE | Admit: 2024-06-01 | Discharge: 2024-06-01 | Disposition: A | Source: Ambulatory Visit

## 2024-06-01 DIAGNOSIS — D6959 Other secondary thrombocytopenia: Secondary | ICD-10-CM | POA: Diagnosis not present

## 2024-06-01 DIAGNOSIS — Z01818 Encounter for other preprocedural examination: Secondary | ICD-10-CM | POA: Diagnosis not present

## 2024-06-01 DIAGNOSIS — J90 Pleural effusion, not elsewhere classified: Secondary | ICD-10-CM | POA: Diagnosis not present

## 2024-06-01 DIAGNOSIS — I25118 Atherosclerotic heart disease of native coronary artery with other forms of angina pectoris: Secondary | ICD-10-CM | POA: Insufficient documentation

## 2024-06-01 DIAGNOSIS — R918 Other nonspecific abnormal finding of lung field: Secondary | ICD-10-CM | POA: Diagnosis not present

## 2024-06-01 DIAGNOSIS — N400 Enlarged prostate without lower urinary tract symptoms: Secondary | ICD-10-CM | POA: Diagnosis present

## 2024-06-01 DIAGNOSIS — I493 Ventricular premature depolarization: Secondary | ICD-10-CM | POA: Diagnosis not present

## 2024-06-01 DIAGNOSIS — Y92230 Patient room in hospital as the place of occurrence of the external cause: Secondary | ICD-10-CM | POA: Diagnosis not present

## 2024-06-01 DIAGNOSIS — E785 Hyperlipidemia, unspecified: Secondary | ICD-10-CM | POA: Diagnosis present

## 2024-06-01 DIAGNOSIS — D689 Coagulation defect, unspecified: Secondary | ICD-10-CM | POA: Diagnosis not present

## 2024-06-01 DIAGNOSIS — Z87442 Personal history of urinary calculi: Secondary | ICD-10-CM | POA: Diagnosis not present

## 2024-06-01 DIAGNOSIS — J449 Chronic obstructive pulmonary disease, unspecified: Secondary | ICD-10-CM | POA: Diagnosis not present

## 2024-06-01 DIAGNOSIS — W0110XA Fall on same level from slipping, tripping and stumbling with subsequent striking against unspecified object, initial encounter: Secondary | ICD-10-CM | POA: Diagnosis not present

## 2024-06-01 DIAGNOSIS — Z951 Presence of aortocoronary bypass graft: Secondary | ICD-10-CM | POA: Diagnosis not present

## 2024-06-01 DIAGNOSIS — Z452 Encounter for adjustment and management of vascular access device: Secondary | ICD-10-CM | POA: Diagnosis not present

## 2024-06-01 DIAGNOSIS — Z981 Arthrodesis status: Secondary | ICD-10-CM | POA: Diagnosis not present

## 2024-06-01 DIAGNOSIS — J9601 Acute respiratory failure with hypoxia: Secondary | ICD-10-CM | POA: Diagnosis not present

## 2024-06-01 DIAGNOSIS — I471 Supraventricular tachycardia, unspecified: Secondary | ICD-10-CM | POA: Diagnosis not present

## 2024-06-01 DIAGNOSIS — Z79899 Other long term (current) drug therapy: Secondary | ICD-10-CM | POA: Diagnosis not present

## 2024-06-01 DIAGNOSIS — I700 Atherosclerosis of aorta: Secondary | ICD-10-CM | POA: Diagnosis not present

## 2024-06-01 DIAGNOSIS — S0990XA Unspecified injury of head, initial encounter: Secondary | ICD-10-CM | POA: Diagnosis not present

## 2024-06-01 DIAGNOSIS — E119 Type 2 diabetes mellitus without complications: Secondary | ICD-10-CM | POA: Diagnosis present

## 2024-06-01 DIAGNOSIS — F419 Anxiety disorder, unspecified: Secondary | ICD-10-CM | POA: Diagnosis present

## 2024-06-01 DIAGNOSIS — J939 Pneumothorax, unspecified: Secondary | ICD-10-CM | POA: Diagnosis not present

## 2024-06-01 DIAGNOSIS — D62 Acute posthemorrhagic anemia: Secondary | ICD-10-CM | POA: Diagnosis not present

## 2024-06-01 DIAGNOSIS — I088 Other rheumatic multiple valve diseases: Secondary | ICD-10-CM | POA: Diagnosis not present

## 2024-06-01 DIAGNOSIS — R0989 Other specified symptoms and signs involving the circulatory and respiratory systems: Secondary | ICD-10-CM | POA: Diagnosis not present

## 2024-06-01 DIAGNOSIS — J9811 Atelectasis: Secondary | ICD-10-CM | POA: Diagnosis not present

## 2024-06-01 DIAGNOSIS — R0602 Shortness of breath: Secondary | ICD-10-CM | POA: Diagnosis present

## 2024-06-01 DIAGNOSIS — I2510 Atherosclerotic heart disease of native coronary artery without angina pectoris: Secondary | ICD-10-CM | POA: Diagnosis present

## 2024-06-01 DIAGNOSIS — I4891 Unspecified atrial fibrillation: Secondary | ICD-10-CM | POA: Diagnosis not present

## 2024-06-01 DIAGNOSIS — G4733 Obstructive sleep apnea (adult) (pediatric): Secondary | ICD-10-CM | POA: Diagnosis present

## 2024-06-01 DIAGNOSIS — Z48812 Encounter for surgical aftercare following surgery on the circulatory system: Secondary | ICD-10-CM | POA: Diagnosis not present

## 2024-06-01 DIAGNOSIS — I10 Essential (primary) hypertension: Secondary | ICD-10-CM | POA: Diagnosis not present

## 2024-06-01 DIAGNOSIS — I5032 Chronic diastolic (congestive) heart failure: Secondary | ICD-10-CM | POA: Diagnosis present

## 2024-06-01 DIAGNOSIS — J95821 Acute postprocedural respiratory failure: Secondary | ICD-10-CM | POA: Diagnosis not present

## 2024-06-01 DIAGNOSIS — I110 Hypertensive heart disease with heart failure: Secondary | ICD-10-CM | POA: Diagnosis present

## 2024-06-01 DIAGNOSIS — R1310 Dysphagia, unspecified: Secondary | ICD-10-CM | POA: Diagnosis not present

## 2024-06-01 DIAGNOSIS — N179 Acute kidney failure, unspecified: Secondary | ICD-10-CM | POA: Diagnosis not present

## 2024-06-01 DIAGNOSIS — Z833 Family history of diabetes mellitus: Secondary | ICD-10-CM | POA: Diagnosis not present

## 2024-06-01 DIAGNOSIS — N990 Postprocedural (acute) (chronic) kidney failure: Secondary | ICD-10-CM | POA: Diagnosis not present

## 2024-06-01 DIAGNOSIS — Z4682 Encounter for fitting and adjustment of non-vascular catheter: Secondary | ICD-10-CM | POA: Diagnosis not present

## 2024-06-01 DIAGNOSIS — E872 Acidosis, unspecified: Secondary | ICD-10-CM | POA: Diagnosis present

## 2024-06-01 HISTORY — DX: Prediabetes: R73.03

## 2024-06-01 HISTORY — DX: Personal history of urinary calculi: Z87.442

## 2024-06-01 HISTORY — DX: Sleep apnea, unspecified: G47.30

## 2024-06-01 LAB — COMPREHENSIVE METABOLIC PANEL WITH GFR
ALT: 26 U/L (ref 0–44)
AST: 26 U/L (ref 15–41)
Albumin: 4.3 g/dL (ref 3.5–5.0)
Alkaline Phosphatase: 55 U/L (ref 38–126)
Anion gap: 9 (ref 5–15)
BUN: 10 mg/dL (ref 8–23)
CO2: 27 mmol/L (ref 22–32)
Calcium: 9.5 mg/dL (ref 8.9–10.3)
Chloride: 104 mmol/L (ref 98–111)
Creatinine, Ser: 1.04 mg/dL (ref 0.61–1.24)
GFR, Estimated: >60 mL/min (ref >60)
Glucose, Bld: 109 mg/dL — ABNORMAL HIGH (ref 70–99)
Potassium: 3.9 mmol/L (ref 3.5–5.1)
Sodium: 140 mmol/L (ref 135–145)
Total Bilirubin: 0.7 mg/dL (ref 0.0–1.2)
Total Protein: 7.5 g/dL (ref 6.5–8.1)

## 2024-06-01 LAB — CBC
HCT: 46.6 % (ref 39.0–52.0)
Hemoglobin: 15.4 g/dL (ref 13.0–17.0)
MCH: 31.8 pg (ref 26.0–34.0)
MCHC: 33 g/dL (ref 30.0–36.0)
MCV: 96.3 fL (ref 80.0–100.0)
Platelets: 165 K/uL (ref 150–400)
RBC: 4.84 MIL/uL (ref 4.22–5.81)
RDW: 13 % (ref 11.5–15.5)
WBC: 5.3 K/uL (ref 4.0–10.5)
nRBC: 0 % (ref 0.0–0.2)

## 2024-06-01 LAB — APTT: aPTT: 28 s (ref 24–36)

## 2024-06-01 LAB — URINALYSIS, ROUTINE W REFLEX MICROSCOPIC
Bacteria, UA: NONE SEEN
Bilirubin Urine: NEGATIVE
Glucose, UA: NEGATIVE mg/dL
Hgb urine dipstick: NEGATIVE
Ketones, ur: NEGATIVE mg/dL
Nitrite: NEGATIVE
Protein, ur: NEGATIVE mg/dL
Specific Gravity, Urine: 1.011 (ref 1.005–1.030)
pH: 5 (ref 5.0–8.0)

## 2024-06-01 LAB — SURGICAL PCR SCREEN
MRSA, PCR: NEGATIVE
Staphylococcus aureus: NEGATIVE

## 2024-06-01 LAB — HEMOGLOBIN A1C
Hgb A1c MFr Bld: 5.5 % (ref 4.8–5.6)
Mean Plasma Glucose: 111.15 mg/dL

## 2024-06-01 LAB — PROTIME-INR
INR: 1 (ref 0.8–1.2)
Prothrombin Time: 13.9 s (ref 11.4–15.2)

## 2024-06-01 MED ORDER — POTASSIUM CHLORIDE 2 MEQ/ML IV SOLN
80.0000 meq | INTRAVENOUS | Status: DC
  Filled 2024-06-01: qty 40

## 2024-06-01 MED ORDER — PLASMA-LYTE A IV SOLN
INTRAVENOUS | Status: DC
  Filled 2024-06-01: qty 2.5

## 2024-06-01 MED ORDER — INSULIN REGULAR(HUMAN) IN NACL 100-0.9 UT/100ML-% IV SOLN
INTRAVENOUS | Status: AC
  Administered 2024-06-02: .8 [IU]/h via INTRAVENOUS
  Filled 2024-06-01: qty 100

## 2024-06-01 MED ORDER — CEFAZOLIN SODIUM-DEXTROSE 2-4 GM/100ML-% IV SOLN
2.0000 g | INTRAVENOUS | Status: AC
  Administered 2024-06-02 (×2): 2 g via INTRAVENOUS
  Filled 2024-06-01: qty 100

## 2024-06-01 MED ORDER — MAGNESIUM SULFATE 50 % IJ SOLN
INTRAMUSCULAR | Status: DC
  Filled 2024-06-01: qty 13

## 2024-06-01 MED ORDER — DEXMEDETOMIDINE HCL IN NACL 400 MCG/100ML IV SOLN
0.1000 ug/kg/h | INTRAVENOUS | Status: AC
  Administered 2024-06-02: .5 ug/kg/h via INTRAVENOUS
  Filled 2024-06-01: qty 100

## 2024-06-01 MED ORDER — TRANEXAMIC ACID (OHS) BOLUS VIA INFUSION
15.0000 mg/kg | INTRAVENOUS | Status: AC
  Administered 2024-06-02: 1030.5 mg via INTRAVENOUS
  Filled 2024-06-01: qty 1031

## 2024-06-01 MED ORDER — VANCOMYCIN HCL 1250 MG/250ML IV SOLN
1250.0000 mg | INTRAVENOUS | Status: AC
  Administered 2024-06-02: 1250 mg via INTRAVENOUS
  Filled 2024-06-01: qty 250

## 2024-06-01 MED ORDER — NOREPINEPHRINE 4 MG/250ML-% IV SOLN
0.0000 ug/min | INTRAVENOUS | Status: AC
  Administered 2024-06-02: 2 ug/min via INTRAVENOUS
  Filled 2024-06-01: qty 250

## 2024-06-01 MED ORDER — HEPARIN 30,000 UNITS/1000 ML (OHS) CELLSAVER SOLUTION
Status: DC
  Filled 2024-06-01: qty 1000

## 2024-06-01 MED ORDER — NITROGLYCERIN IN D5W 200-5 MCG/ML-% IV SOLN
2.0000 ug/min | INTRAVENOUS | Status: DC
Start: 2024-06-02 — End: 2024-06-03
  Filled 2024-06-01: qty 250

## 2024-06-01 MED ORDER — CEFAZOLIN SODIUM-DEXTROSE 2-4 GM/100ML-% IV SOLN
2.0000 g | INTRAVENOUS | Status: DC
  Filled 2024-06-01: qty 100

## 2024-06-01 MED ORDER — PHENYLEPHRINE HCL-NACL 20-0.9 MG/250ML-% IV SOLN
30.0000 ug/min | INTRAVENOUS | Status: AC
  Administered 2024-06-02: 15 ug/min via INTRAVENOUS
  Filled 2024-06-01: qty 250

## 2024-06-01 MED ORDER — EPINEPHRINE HCL 5 MG/250ML IV SOLN IN NS
0.0000 ug/min | INTRAVENOUS | Status: DC
Start: 2024-06-02 — End: 2024-06-03
  Filled 2024-06-01: qty 250

## 2024-06-01 MED ORDER — MILRINONE LACTATE IN DEXTROSE 20-5 MG/100ML-% IV SOLN
0.3000 ug/kg/min | INTRAVENOUS | Status: DC
  Filled 2024-06-01: qty 100

## 2024-06-01 MED ORDER — TRANEXAMIC ACID 1000 MG/10ML IV SOLN
1.5000 mg/kg/h | INTRAVENOUS | Status: AC
  Administered 2024-06-02: 1.5 mg/kg/h via INTRAVENOUS
  Filled 2024-06-01: qty 25

## 2024-06-01 MED ORDER — TRANEXAMIC ACID (OHS) PUMP PRIME SOLUTION
2.0000 mg/kg | INTRAVENOUS | Status: DC
  Filled 2024-06-01: qty 1.37

## 2024-06-01 NOTE — Anesthesia Preprocedure Evaluation (Signed)
 Anesthesia Evaluation  Patient identified by MRN, date of birth, ID band Patient awake    Reviewed: Allergy & Precautions, NPO status , Patient's Chart, lab work & pertinent test results  History of Anesthesia Complications Negative for: history of anesthetic complications  Airway Mallampati: I  TM Distance: >3 FB Neck ROM: Full    Dental  (+) Edentulous Upper, Missing, Dental Advisory Given   Pulmonary sleep apnea , COPD,  COPD inhaler   breath sounds clear to auscultation       Cardiovascular hypertension, Pt. on medications + angina  + CAD (3v ASCAD)   Rhythm:Regular Rate:Normal  05/26/2024 ECHO :EF 60-65% 1. The LV has normal function, Grade 1 DD 2. RVF normal 3. No significant valvular abnormalities   Neuro/Psych negative neurological ROS     GI/Hepatic negative GI ROS, Neg liver ROS,,,  Endo/Other  negative endocrine ROS    Renal/GU negative Renal ROS     Musculoskeletal   Abdominal   Peds  Hematology Hb 15.4, plt 165k   Anesthesia Other Findings   Reproductive/Obstetrics                              Anesthesia Physical Anesthesia Plan  ASA: 4  Anesthesia Plan: General   Post-op Pain Management:    Induction: Intravenous  PONV Risk Score and Plan: 2 and Treatment may vary due to age or medical condition  Airway Management Planned: Oral ETT  Additional Equipment: Arterial line, CVP, TEE and Ultrasound Guidance Line Placement  Intra-op Plan:   Post-operative Plan: Post-operative intubation/ventilation  Informed Consent: I have reviewed the patients History and Physical, chart, labs and discussed the procedure including the risks, benefits and alternatives for the proposed anesthesia with the patient or authorized representative who has indicated his/her understanding and acceptance.     Dental advisory given  Plan Discussed with: CRNA and Surgeon  Anesthesia Plan  Comments:          Anesthesia Quick Evaluation

## 2024-06-01 NOTE — Progress Notes (Signed)
 Surgical Instructions   Your procedure is scheduled on Tuesday June 02, 2024. Report to [facility] Main Entrance A at 5:30 A.M., then check in with the Admitting office. Any questions or running late day of surgery: call [phone]  Questions prior to your surgery date: call [phone], Monday-Friday, 8am-4pm. If you experience any cold or flu symptoms such as cough, fever, chills, shortness of breath, etc. between now and your scheduled surgery, please notify us  at the above number.     Remember:  Do not eat or drink after midnight the night before your surgery  Take these medicines the morning of surgery with A SIP OF WATER -  None  May take these medicines IF NEEDED: albuterol (VENTOLIN HFA) 108 (90 Base) MCG/ACT inhaler Please bring with you to the hospital meclizine (ANTIVERT)    DO NOT TAKE YOUR ASPIRIN  THE MORNING OF SURGERY.   One week prior to surgery, STOP taking any Aleve, Naproxen, Ibuprofen, Motrin, Advil, Goody's, BC's, all herbal medications, fish oil, and non-prescription vitamins.                     Do NOT Smoke (Tobacco/Vaping) for 24 hours prior to your procedure.  If you use a CPAP at night, you may bring your mask/headgear for your overnight stay.   You will be asked to remove any contacts, glasses, piercing's, hearing aid's, dentures/partials prior to surgery. Please bring cases for these items if needed.    Patients discharged the day of surgery will not be allowed to drive home, and someone needs to stay with them for 24 hours.  SURGICAL WAITING ROOM VISITATION Patients may have no more than 2 support people in the waiting area - these visitors may rotate.   Pre-op nurse will coordinate an appropriate time for 1 ADULT support person, who may not rotate, to accompany patient in pre-op.  Children under the age of 14 must have an adult with them who is not the patient and must remain in the main waiting area with an adult.  If the patient needs to  stay at the hospital during part of their recovery, the visitor guidelines for inpatient rooms apply.  Please refer to the [facility] website for the visitor guidelines for any additional information.   If you received a COVID test during your pre-op visit  it is requested that you wear a mask when out in public, stay away from anyone that may not be feeling well and notify your surgeon if you develop symptoms. If you have been in contact with anyone that has tested positive in the last 10 days please notify you surgeon.      Pre-operative CHG Bathing Instructions   You can play a key role in reducing the risk of infection after surgery. Your skin needs to be as free of germs as possible. You can reduce the number of germs on your skin by washing with CHG (chlorhexidine gluconate) soap before surgery. CHG is an antiseptic soap that kills germs and continues to kill germs even after washing.   DO NOT use if you have an allergy to chlorhexidine/CHG or antibacterial soaps. If your skin becomes reddened or irritated, stop using the CHG and notify one of our RNs at [phone].              TAKE A SHOWER THE NIGHT BEFORE SURGERY   Please keep in mind the following:  You may shave your face before/day of surgery.  Place clean sheets  on your bed the night before surgery Use a clean washcloth (not used since being washed) for shower. DO NOT sleep with pet's night before surgery.  CHG Shower Instructions:  Wash your face and private area with normal soap. If you choose to wash your hair, wash first with your normal shampoo.  After you use shampoo/soap, rinse your hair and body thoroughly to remove shampoo/soap residue.  Turn the water OFF and apply half the bottle of CHG soap to a CLEAN washcloth.  Apply CHG soap ONLY FROM YOUR NECK DOWN TO YOUR TOES (washing for 3-5 minutes)  DO NOT use CHG soap on face, private areas, open wounds, or sores.  Pay special attention to the area where your  surgery is being performed.  If you are having back surgery, having someone wash your back for you may be helpful. Wait 2 minutes after CHG soap is applied, then you may rinse off the CHG soap.  Pat dry with a clean towel  Put on clean pajamas    Additional instructions for the day of surgery: If you choose, you may shower the morning of surgery with an antibacterial soap.  DO NOT APPLY any lotions, deodorants or cologne.   Do not wear jewelry  Do not bring valuables to the hospital. [facility] is not responsible for valuables/personal belongings. Put on clean/comfortable clothes.  Please brush your teeth.  Ask your nurse before applying any prescription medications to the skin.

## 2024-06-01 NOTE — Discharge Instructions (Signed)
 Discharge Instructions:  1. You may shower, please wash incisions daily with soap and water  and keep dry.  If you wish to cover wounds with dressing you may do so but please keep clean and change daily.  No tub baths or swimming until incisions have completely healed.  If your incisions become red or develop any drainage please call our office at [phone]  2. No Driving until cleared by Dr. Linward office and you are no longer using narcotic pain medications  3. Monitor your weight daily.. Please use the same scale and weigh at same time... If you gain 5-10 lbs in 48 hours with associated lower extremity swelling, please contact our office at [phone]  4. Fever of 101.5 for at least 24 hours with no source, please contact our office at [phone]  5. Activity- up as tolerated, please walk at least 3 times per day.  Avoid strenuous activity, no lifting, pushing, or pulling with your arms over 8-10 lbs for a minimum of 6 weeks  6. If any questions or concerns arise, please do not hesitate to contact our office at [phone]

## 2024-06-01 NOTE — Hospital Course (Addendum)
 HPI: This is a 73 y.o. 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  The patient is very anxious and has reports progressive fatigue and shortness of breath that has worsened.  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  He reports chest pain that feels like a tremble but that it comes and goes.  He has never had a stroke but has a son who has had 2 strokes.  He reports orthopnea (although likely anxiety related) and sometimes gets dizzy.  He has also had a lot of kidney stones in the past. He has never smoked and does not use alcohol.  Dr. Daniel discussed the need for coronary artery bypass grafting surgery. Potential risks, benefits, and complications of the surgery were discussed with the patient and he agreed to proceed with surgery. Pre operative carotid US  showed no significant internal carotid artery stenosis bilaterally.  Hospital Course: Patient underwent a CABG x 4. He was transported from the OR to [facility] ICU in stable condition. He was extubated late the evening of surgery. He did receive multiple units (3 units) of PRBCs in the OR. He was weaned off Epinephrine and Nor epinephrine drips.He was weaned off the Insulin drip. His pre op HGA1C  5.5. Accu checks and SS PRN will be stopped after transfer to the floor. He was above his pre op weight and diuresed accordingly. He had complaints of dysphagia on POD 2 so speech evaluated him. Recommendations were followed accordingly (regular, thin liquid diet, eating must be done upright, etc).He was above his pre op weight and was diuresed accordingly, with careful monitoring of his creatinine as he had AKI post op. He had both expected blood loss anemia and thrombocytopenia from CPB and coagulopathy post op. His platelet count went as low as 74,000 but did gradually improve. Foley, EPW, and remaining chest tube were removed on 10/17. He was stable  for transfer from the ICU to 4E on 10/18.  He did develop some bursts of atrial fibrillation/SVT as well as frequent PVCs and has been started on oral amiodarone as well as p.o. metoprolol .  He does have some expected postoperative volume overload and has been diuresed accordingly.  He did develop AKI but this has been improving steadily over time.

## 2024-06-02 ENCOUNTER — Other Ambulatory Visit: Payer: Self-pay

## 2024-06-02 ENCOUNTER — Inpatient Hospital Stay (HOSPITAL_COMMUNITY)

## 2024-06-02 ENCOUNTER — Encounter (HOSPITAL_COMMUNITY): Payer: Self-pay

## 2024-06-02 ENCOUNTER — Inpatient Hospital Stay (HOSPITAL_COMMUNITY): Payer: Self-pay

## 2024-06-02 ENCOUNTER — Inpatient Hospital Stay (HOSPITAL_COMMUNITY)
Admission: AD | Admit: 2024-06-02 | Discharge: 2024-06-08 | DRG: 235 | Disposition: A | Discharge status: Home / Self Care

## 2024-06-02 ENCOUNTER — Inpatient Hospital Stay (HOSPITAL_COMMUNITY): Admission: AD | Disposition: A | Discharge status: Home / Self Care | Payer: Self-pay | Source: Home / Self Care

## 2024-06-02 DIAGNOSIS — N990 Postprocedural (acute) (chronic) kidney failure: Secondary | ICD-10-CM | POA: Diagnosis not present

## 2024-06-02 DIAGNOSIS — Z4682 Encounter for fitting and adjustment of non-vascular catheter: Secondary | ICD-10-CM | POA: Diagnosis not present

## 2024-06-02 DIAGNOSIS — E872 Acidosis, unspecified: Secondary | ICD-10-CM | POA: Diagnosis present

## 2024-06-02 DIAGNOSIS — Z7982 Long term (current) use of aspirin: Secondary | ICD-10-CM

## 2024-06-02 DIAGNOSIS — Z951 Presence of aortocoronary bypass graft: Secondary | ICD-10-CM | POA: Diagnosis not present

## 2024-06-02 DIAGNOSIS — I4891 Unspecified atrial fibrillation: Secondary | ICD-10-CM | POA: Diagnosis not present

## 2024-06-02 DIAGNOSIS — Z8419 Family history of other disorders of kidney and ureter: Secondary | ICD-10-CM

## 2024-06-02 DIAGNOSIS — I493 Ventricular premature depolarization: Secondary | ICD-10-CM | POA: Diagnosis not present

## 2024-06-02 DIAGNOSIS — K5900 Constipation, unspecified: Secondary | ICD-10-CM | POA: Diagnosis not present

## 2024-06-02 DIAGNOSIS — R1310 Dysphagia, unspecified: Secondary | ICD-10-CM | POA: Diagnosis not present

## 2024-06-02 DIAGNOSIS — R918 Other nonspecific abnormal finding of lung field: Secondary | ICD-10-CM | POA: Diagnosis not present

## 2024-06-02 DIAGNOSIS — G4730 Sleep apnea, unspecified: Secondary | ICD-10-CM | POA: Insufficient documentation

## 2024-06-02 DIAGNOSIS — J9811 Atelectasis: Secondary | ICD-10-CM | POA: Diagnosis not present

## 2024-06-02 DIAGNOSIS — I5032 Chronic diastolic (congestive) heart failure: Secondary | ICD-10-CM | POA: Diagnosis present

## 2024-06-02 DIAGNOSIS — G4733 Obstructive sleep apnea (adult) (pediatric): Secondary | ICD-10-CM | POA: Diagnosis present

## 2024-06-02 DIAGNOSIS — Y92230 Patient room in hospital as the place of occurrence of the external cause: Secondary | ICD-10-CM | POA: Diagnosis not present

## 2024-06-02 DIAGNOSIS — J95821 Acute postprocedural respiratory failure: Secondary | ICD-10-CM | POA: Diagnosis not present

## 2024-06-02 DIAGNOSIS — N400 Enlarged prostate without lower urinary tract symptoms: Secondary | ICD-10-CM | POA: Diagnosis present

## 2024-06-02 DIAGNOSIS — E785 Hyperlipidemia, unspecified: Secondary | ICD-10-CM | POA: Diagnosis present

## 2024-06-02 DIAGNOSIS — Z87442 Personal history of urinary calculi: Secondary | ICD-10-CM | POA: Diagnosis not present

## 2024-06-02 DIAGNOSIS — I2510 Atherosclerotic heart disease of native coronary artery without angina pectoris: Secondary | ICD-10-CM

## 2024-06-02 DIAGNOSIS — R7303 Prediabetes: Secondary | ICD-10-CM | POA: Insufficient documentation

## 2024-06-02 DIAGNOSIS — I110 Hypertensive heart disease with heart failure: Secondary | ICD-10-CM | POA: Diagnosis present

## 2024-06-02 DIAGNOSIS — Z9841 Cataract extraction status, right eye: Secondary | ICD-10-CM

## 2024-06-02 DIAGNOSIS — Z01818 Encounter for other preprocedural examination: Principal | ICD-10-CM

## 2024-06-02 DIAGNOSIS — R0602 Shortness of breath: Secondary | ICD-10-CM | POA: Diagnosis present

## 2024-06-02 DIAGNOSIS — Z9842 Cataract extraction status, left eye: Secondary | ICD-10-CM

## 2024-06-02 DIAGNOSIS — D689 Coagulation defect, unspecified: Secondary | ICD-10-CM | POA: Diagnosis not present

## 2024-06-02 DIAGNOSIS — I8393 Asymptomatic varicose veins of bilateral lower extremities: Secondary | ICD-10-CM | POA: Diagnosis present

## 2024-06-02 DIAGNOSIS — N179 Acute kidney failure, unspecified: Secondary | ICD-10-CM | POA: Diagnosis not present

## 2024-06-02 DIAGNOSIS — W0110XA Fall on same level from slipping, tripping and stumbling with subsequent striking against unspecified object, initial encounter: Secondary | ICD-10-CM | POA: Diagnosis not present

## 2024-06-02 DIAGNOSIS — Z79899 Other long term (current) drug therapy: Secondary | ICD-10-CM | POA: Diagnosis not present

## 2024-06-02 DIAGNOSIS — F419 Anxiety disorder, unspecified: Secondary | ICD-10-CM | POA: Diagnosis present

## 2024-06-02 DIAGNOSIS — I471 Supraventricular tachycardia, unspecified: Secondary | ICD-10-CM | POA: Diagnosis not present

## 2024-06-02 DIAGNOSIS — Z833 Family history of diabetes mellitus: Secondary | ICD-10-CM | POA: Diagnosis not present

## 2024-06-02 DIAGNOSIS — I10 Essential (primary) hypertension: Secondary | ICD-10-CM

## 2024-06-02 DIAGNOSIS — I25118 Atherosclerotic heart disease of native coronary artery with other forms of angina pectoris: Secondary | ICD-10-CM | POA: Diagnosis not present

## 2024-06-02 DIAGNOSIS — M1990 Unspecified osteoarthritis, unspecified site: Secondary | ICD-10-CM | POA: Diagnosis present

## 2024-06-02 DIAGNOSIS — J939 Pneumothorax, unspecified: Secondary | ICD-10-CM | POA: Diagnosis not present

## 2024-06-02 DIAGNOSIS — D62 Acute posthemorrhagic anemia: Secondary | ICD-10-CM | POA: Diagnosis not present

## 2024-06-02 DIAGNOSIS — R008 Other abnormalities of heart beat: Secondary | ICD-10-CM | POA: Diagnosis present

## 2024-06-02 DIAGNOSIS — E119 Type 2 diabetes mellitus without complications: Secondary | ICD-10-CM | POA: Diagnosis present

## 2024-06-02 DIAGNOSIS — I440 Atrioventricular block, first degree: Secondary | ICD-10-CM | POA: Diagnosis present

## 2024-06-02 DIAGNOSIS — Z961 Presence of intraocular lens: Secondary | ICD-10-CM | POA: Diagnosis present

## 2024-06-02 DIAGNOSIS — D6959 Other secondary thrombocytopenia: Secondary | ICD-10-CM | POA: Diagnosis not present

## 2024-06-02 DIAGNOSIS — Z888 Allergy status to other drugs, medicaments and biological substances status: Secondary | ICD-10-CM

## 2024-06-02 DIAGNOSIS — Z885 Allergy status to narcotic agent status: Secondary | ICD-10-CM

## 2024-06-02 DIAGNOSIS — J9601 Acute respiratory failure with hypoxia: Secondary | ICD-10-CM | POA: Diagnosis not present

## 2024-06-02 HISTORY — PX: CORONARY ARTERY BYPASS GRAFT: SHX141

## 2024-06-02 HISTORY — PX: INTRAOPERATIVE TRANSESOPHAGEAL ECHOCARDIOGRAM: SHX5062

## 2024-06-02 LAB — POCT I-STAT 7, (LYTES, BLD GAS, ICA,H+H)
Acid-base deficit: 1 mmol/L (ref 0.0–2.0)
Acid-base deficit: 3 mmol/L — ABNORMAL HIGH (ref 0.0–2.0)
Acid-base deficit: 3 mmol/L — ABNORMAL HIGH (ref 0.0–2.0)
Acid-base deficit: 6 mmol/L — ABNORMAL HIGH (ref 0.0–2.0)
Acid-base deficit: 9 mmol/L — ABNORMAL HIGH (ref 0.0–2.0)
Bicarbonate: 23.9 mmol/L (ref 20.0–28.0)
Bicarbonate: 22.8 mmol/L (ref 20.0–28.0)
Bicarbonate: 21.6 mmol/L (ref 20.0–28.0)
Bicarbonate: 19 mmol/L — ABNORMAL LOW (ref 20.0–28.0)
Bicarbonate: 16.6 mmol/L — ABNORMAL LOW (ref 20.0–28.0)
Calcium, Ion: 1.22 mmol/L (ref 1.15–1.40)
Calcium, Ion: 1 mmol/L — ABNORMAL LOW (ref 1.15–1.40)
Calcium, Ion: 0.83 mmol/L — CL (ref 1.15–1.40)
Calcium, Ion: 0.82 mmol/L — CL (ref 1.15–1.40)
Calcium, Ion: 0.94 mmol/L — ABNORMAL LOW (ref 1.15–1.40)
HCT: 36 % — ABNORMAL LOW (ref 39.0–52.0)
HCT: 24 % — ABNORMAL LOW (ref 39.0–52.0)
HCT: 17 % — ABNORMAL LOW (ref 39.0–52.0)
HCT: 23 % — ABNORMAL LOW (ref 39.0–52.0)
HCT: 15 % — ABNORMAL LOW (ref 39.0–52.0)
Hemoglobin: 12.2 g/dL — ABNORMAL LOW (ref 13.0–17.0)
Hemoglobin: 8.2 g/dL — ABNORMAL LOW (ref 13.0–17.0)
Hemoglobin: 5.8 g/dL — CL (ref 13.0–17.0)
Hemoglobin: 7.8 g/dL — ABNORMAL LOW (ref 13.0–17.0)
Hemoglobin: 5.1 g/dL — CL (ref 13.0–17.0)
O2 Saturation: 100 %
O2 Saturation: 100 %
O2 Saturation: 100 %
O2 Saturation: 98 %
O2 Saturation: 94 %
Patient temperature: 35.6
Patient temperature: 37.7
Potassium: 3.8 mmol/L (ref 3.5–5.1)
Potassium: 3.7 mmol/L (ref 3.5–5.1)
Potassium: 3.7 mmol/L (ref 3.5–5.1)
Potassium: 3.7 mmol/L (ref 3.5–5.1)
Potassium: 3.6 mmol/L (ref 3.5–5.1)
Sodium: 141 mmol/L (ref 135–145)
Sodium: 132 mmol/L — ABNORMAL LOW (ref 135–145)
Sodium: 141 mmol/L (ref 135–145)
Sodium: 145 mmol/L (ref 135–145)
Sodium: 142 mmol/L (ref 135–145)
TCO2: 25 mmol/L (ref 22–32)
TCO2: 24 mmol/L (ref 22–32)
TCO2: 23 mmol/L (ref 22–32)
TCO2: 20 mmol/L — ABNORMAL LOW (ref 22–32)
TCO2: 18 mmol/L — ABNORMAL LOW (ref 22–32)
pCO2 arterial: 39.4 mmHg (ref 32–48)
pCO2 arterial: 43.8 mmHg (ref 32–48)
pCO2 arterial: 37 mmHg (ref 32–48)
pCO2 arterial: 30.7 mmHg — ABNORMAL LOW (ref 32–48)
pCO2 arterial: 32.8 mmHg (ref 32–48)
pH, Arterial: 7.39 (ref 7.35–7.45)
pH, Arterial: 7.325 — ABNORMAL LOW (ref 7.35–7.45)
pH, Arterial: 7.375 (ref 7.35–7.45)
pH, Arterial: 7.393 (ref 7.35–7.45)
pH, Arterial: 7.316 — ABNORMAL LOW (ref 7.35–7.45)
pO2, Arterial: 356 mmHg — ABNORMAL HIGH (ref 83–108)
pO2, Arterial: 434 mmHg — ABNORMAL HIGH (ref 83–108)
pO2, Arterial: 387 mmHg — ABNORMAL HIGH (ref 83–108)
pO2, Arterial: 90 mmHg (ref 83–108)
pO2, Arterial: 78 mmHg — ABNORMAL LOW (ref 83–108)

## 2024-06-02 LAB — POCT I-STAT, CHEM 8
BUN: 12 mg/dL (ref 8–23)
BUN: 12 mg/dL (ref 8–23)
BUN: 10 mg/dL (ref 8–23)
BUN: 10 mg/dL (ref 8–23)
BUN: 11 mg/dL (ref 8–23)
BUN: 8 mg/dL (ref 8–23)
BUN: 7 mg/dL — ABNORMAL LOW (ref 8–23)
Calcium, Ion: 1.25 mmol/L (ref 1.15–1.40)
Calcium, Ion: 1.22 mmol/L (ref 1.15–1.40)
Calcium, Ion: 1.01 mmol/L — ABNORMAL LOW (ref 1.15–1.40)
Calcium, Ion: 0.84 mmol/L — CL (ref 1.15–1.40)
Calcium, Ion: 1.01 mmol/L — ABNORMAL LOW (ref 1.15–1.40)
Calcium, Ion: 0.83 mmol/L — CL (ref 1.15–1.40)
Calcium, Ion: 0.82 mmol/L — CL (ref 1.15–1.40)
Chloride: 102 mmol/L (ref 98–111)
Chloride: 102 mmol/L (ref 98–111)
Chloride: 94 mmol/L — ABNORMAL LOW (ref 98–111)
Chloride: 96 mmol/L — ABNORMAL LOW (ref 98–111)
Chloride: 96 mmol/L — ABNORMAL LOW (ref 98–111)
Chloride: 100 mmol/L (ref 98–111)
Chloride: 102 mmol/L (ref 98–111)
Creatinine, Ser: 0.8 mg/dL (ref 0.61–1.24)
Creatinine, Ser: 0.9 mg/dL (ref 0.61–1.24)
Creatinine, Ser: 0.7 mg/dL (ref 0.61–1.24)
Creatinine, Ser: 0.7 mg/dL (ref 0.61–1.24)
Creatinine, Ser: 0.7 mg/dL (ref 0.61–1.24)
Creatinine, Ser: 0.7 mg/dL (ref 0.61–1.24)
Creatinine, Ser: 0.7 mg/dL (ref 0.61–1.24)
Glucose, Bld: 107 mg/dL — ABNORMAL HIGH (ref 70–99)
Glucose, Bld: 112 mg/dL — ABNORMAL HIGH (ref 70–99)
Glucose, Bld: 98 mg/dL (ref 70–99)
Glucose, Bld: 142 mg/dL — ABNORMAL HIGH (ref 70–99)
Glucose, Bld: 150 mg/dL — ABNORMAL HIGH (ref 70–99)
Glucose, Bld: 150 mg/dL — ABNORMAL HIGH (ref 70–99)
Glucose, Bld: 123 mg/dL — ABNORMAL HIGH (ref 70–99)
HCT: 37 % — ABNORMAL LOW (ref 39.0–52.0)
HCT: 36 % — ABNORMAL LOW (ref 39.0–52.0)
HCT: 23 % — ABNORMAL LOW (ref 39.0–52.0)
HCT: 20 % — ABNORMAL LOW (ref 39.0–52.0)
HCT: 25 % — ABNORMAL LOW (ref 39.0–52.0)
HCT: 17 % — ABNORMAL LOW (ref 39.0–52.0)
HCT: 22 % — ABNORMAL LOW (ref 39.0–52.0)
Hemoglobin: 12.6 g/dL — ABNORMAL LOW (ref 13.0–17.0)
Hemoglobin: 12.2 g/dL — ABNORMAL LOW (ref 13.0–17.0)
Hemoglobin: 7.8 g/dL — ABNORMAL LOW (ref 13.0–17.0)
Hemoglobin: 6.8 g/dL — CL (ref 13.0–17.0)
Hemoglobin: 8.5 g/dL — ABNORMAL LOW (ref 13.0–17.0)
Hemoglobin: 5.8 g/dL — CL (ref 13.0–17.0)
Hemoglobin: 7.5 g/dL — ABNORMAL LOW (ref 13.0–17.0)
Potassium: 3.8 mmol/L (ref 3.5–5.1)
Potassium: 4 mmol/L (ref 3.5–5.1)
Potassium: 3.8 mmol/L (ref 3.5–5.1)
Potassium: 4.9 mmol/L (ref 3.5–5.1)
Potassium: 5.1 mmol/L (ref 3.5–5.1)
Potassium: 3.7 mmol/L (ref 3.5–5.1)
Potassium: 3.8 mmol/L (ref 3.5–5.1)
Sodium: 142 mmol/L (ref 135–145)
Sodium: 142 mmol/L (ref 135–145)
Sodium: 132 mmol/L — ABNORMAL LOW (ref 135–145)
Sodium: 134 mmol/L — ABNORMAL LOW (ref 135–145)
Sodium: 137 mmol/L (ref 135–145)
Sodium: 141 mmol/L (ref 135–145)
Sodium: 144 mmol/L (ref 135–145)
TCO2: 26 mmol/L (ref 22–32)
TCO2: 27 mmol/L (ref 22–32)
TCO2: 26 mmol/L (ref 22–32)
TCO2: 29 mmol/L (ref 22–32)
TCO2: 29 mmol/L (ref 22–32)
TCO2: 23 mmol/L (ref 22–32)
TCO2: 22 mmol/L (ref 22–32)

## 2024-06-02 LAB — GLUCOSE, CAPILLARY
Glucose-Capillary: 117 mg/dL — ABNORMAL HIGH (ref 70–99)
Glucose-Capillary: 162 mg/dL — ABNORMAL HIGH (ref 70–99)
Glucose-Capillary: 160 mg/dL — ABNORMAL HIGH (ref 70–99)
Glucose-Capillary: 169 mg/dL — ABNORMAL HIGH (ref 70–99)
Glucose-Capillary: 158 mg/dL — ABNORMAL HIGH (ref 70–99)

## 2024-06-02 LAB — PROTIME-INR
INR: 2.5 — ABNORMAL HIGH (ref 0.8–1.2)
Prothrombin Time: 28.4 s — ABNORMAL HIGH (ref 11.4–15.2)

## 2024-06-02 LAB — HEMOGLOBIN AND HEMATOCRIT, BLOOD
HCT: 19.5 % — ABNORMAL LOW (ref 39.0–52.0)
Hemoglobin: 6.7 g/dL — CL (ref 13.0–17.0)

## 2024-06-02 LAB — BASIC METABOLIC PANEL WITH GFR
Anion gap: 13 (ref 5–15)
BUN: 9 mg/dL (ref 8–23)
CO2: 20 mmol/L — ABNORMAL LOW (ref 22–32)
Calcium: 6.7 mg/dL — ABNORMAL LOW (ref 8.9–10.3)
Chloride: 105 mmol/L (ref 98–111)
Creatinine, Ser: 1.08 mg/dL (ref 0.61–1.24)
GFR, Estimated: >60 mL/min (ref >60)
Glucose, Bld: 163 mg/dL — ABNORMAL HIGH (ref 70–99)
Potassium: 3.8 mmol/L (ref 3.5–5.1)
Sodium: 138 mmol/L (ref 135–145)

## 2024-06-02 LAB — CBC
HCT: 21.9 % — ABNORMAL LOW (ref 39.0–52.0)
HCT: 21.1 % — ABNORMAL LOW (ref 39.0–52.0)
Hemoglobin: 7.4 g/dL — ABNORMAL LOW (ref 13.0–17.0)
Hemoglobin: 7.2 g/dL — ABNORMAL LOW (ref 13.0–17.0)
MCH: 31.9 pg (ref 26.0–34.0)
MCH: 30.9 pg (ref 26.0–34.0)
MCHC: 33.8 g/dL (ref 30.0–36.0)
MCHC: 34.1 g/dL (ref 30.0–36.0)
MCV: 94.4 fL (ref 80.0–100.0)
MCV: 90.6 fL (ref 80.0–100.0)
Platelets: 66 K/uL — ABNORMAL LOW (ref 150–400)
Platelets: 90 K/uL — ABNORMAL LOW (ref 150–400)
RBC: 2.32 MIL/uL — ABNORMAL LOW (ref 4.22–5.81)
RBC: 2.33 MIL/uL — ABNORMAL LOW (ref 4.22–5.81)
RDW: 15.8 % — ABNORMAL HIGH (ref 11.5–15.5)
RDW: 16.2 % — ABNORMAL HIGH (ref 11.5–15.5)
WBC: 7.4 K/uL (ref 4.0–10.5)
WBC: 8.6 K/uL (ref 4.0–10.5)
nRBC: 0 % (ref 0.0–0.2)
nRBC: 0 % (ref 0.0–0.2)

## 2024-06-02 LAB — FIBRINOGEN
Fibrinogen: 126 mg/dL — ABNORMAL LOW (ref 210–475)
Fibrinogen: 108 mg/dL — ABNORMAL LOW (ref 210–475)

## 2024-06-02 LAB — MAGNESIUM: Magnesium: 2.7 mg/dL — ABNORMAL HIGH (ref 1.7–2.4)

## 2024-06-02 LAB — PREPARE RBC (CROSSMATCH)

## 2024-06-02 LAB — ECHO INTRAOPERATIVE TEE
Height: 65 in
Weight: 2400 [oz_av]

## 2024-06-02 LAB — ABO/RH: ABO/RH(D): A POS

## 2024-06-02 LAB — PLATELET COUNT: Platelets: 93 K/uL — ABNORMAL LOW (ref 150–400)

## 2024-06-02 LAB — APTT: aPTT: 54 s — ABNORMAL HIGH (ref 24–36)

## 2024-06-02 SURGERY — CORONARY ARTERY BYPASS GRAFTING (CABG)
Anesthesia: General | Site: Chest

## 2024-06-02 MED ORDER — NOREPINEPHRINE 4 MG/250ML-% IV SOLN
0.0000 ug/min | INTRAVENOUS | Status: DC
  Administered 2024-06-02: 10 ug/min via INTRAVENOUS
  Filled 2024-06-02: qty 250

## 2024-06-02 MED ORDER — MIDAZOLAM HCL 5 MG/5ML IJ SOLN
INTRAMUSCULAR | Status: DC | PRN
  Administered 2024-06-02 (×6): 1 mg via INTRAVENOUS

## 2024-06-02 MED ORDER — LACTATED RINGERS IV SOLN
INTRAVENOUS | Status: DC | PRN
Start: 2024-06-02 — End: 2024-06-02

## 2024-06-02 MED ORDER — FENTANYL CITRATE (PF) 250 MCG/5ML IJ SOLN
INTRAMUSCULAR | Status: AC
  Filled 2024-06-02: qty 5

## 2024-06-02 MED ORDER — PROTAMINE SULFATE 10 MG/ML IV SOLN
INTRAVENOUS | Status: AC
  Filled 2024-06-02: qty 25

## 2024-06-02 MED ORDER — ORAL CARE MOUTH RINSE: 15.0000 mL | Freq: Once | OROMUCOSAL | Status: DC

## 2024-06-02 MED ORDER — METOPROLOL TARTRATE 12.5 MG HALF TABLET
12.5000 mg | ORAL_TABLET | Freq: Once | ORAL | Status: AC
  Administered 2024-06-02: 12.5 mg via ORAL
  Filled 2024-06-02: qty 1

## 2024-06-02 MED ORDER — ORAL CARE MOUTH RINSE
15.0000 mL | OROMUCOSAL | Status: DC
  Administered 2024-06-02 – 2024-06-03 (×9): 15 mL via OROMUCOSAL

## 2024-06-02 MED ORDER — ACETAMINOPHEN 160 MG/5ML PO SOLN
650.0000 mg | Freq: Once | ORAL | Status: AC
  Administered 2024-06-02: 650 mg
  Filled 2024-06-02: qty 20.3

## 2024-06-02 MED ORDER — CHLORHEXIDINE GLUCONATE 0.12 % MT SOLN
15.0000 mL | Freq: Once | OROMUCOSAL | Status: DC
  Filled 2024-06-02: qty 15

## 2024-06-02 MED ORDER — ALBUMIN HUMAN 5 % IV SOLN: INTRAVENOUS | Status: DC | PRN

## 2024-06-02 MED ORDER — ASPIRIN 81 MG PO CHEW: 324.0000 mg | CHEWABLE_TABLET | Freq: Every day | ORAL | Status: DC

## 2024-06-02 MED ORDER — ACETAMINOPHEN 160 MG/5ML PO SOLN
1000.0000 mg | Freq: Four times a day (QID) | ORAL | Status: DC
  Filled 2024-06-02: qty 40.6

## 2024-06-02 MED ORDER — PHENYLEPHRINE 80 MCG/ML (10ML) SYRINGE FOR IV PUSH (FOR BLOOD PRESSURE SUPPORT)
PREFILLED_SYRINGE | INTRAVENOUS | Status: DC | PRN
  Administered 2024-06-02: 160 ug via INTRAVENOUS
  Administered 2024-06-02: 80 ug via INTRAVENOUS
  Administered 2024-06-02: 160 ug via INTRAVENOUS
  Administered 2024-06-02 (×3): 80 ug via INTRAVENOUS

## 2024-06-02 MED ORDER — 0.9 % SODIUM CHLORIDE (POUR BTL) OPTIME
TOPICAL | Status: DC | PRN
  Administered 2024-06-02: 5000 mL
  Administered 2024-06-02: 1000 mL

## 2024-06-02 MED ORDER — PHENYLEPHRINE 80 MCG/ML (10ML) SYRINGE FOR IV PUSH (FOR BLOOD PRESSURE SUPPORT)
PREFILLED_SYRINGE | INTRAVENOUS | Status: AC
  Filled 2024-06-02: qty 10

## 2024-06-02 MED ORDER — EPINEPHRINE HCL 5 MG/250ML IV SOLN IN NS: 0.5000 ug/min | INTRAVENOUS | Status: DC

## 2024-06-02 MED ORDER — LACTATED RINGERS IV SOLN
INTRAVENOUS | Status: DC
Start: 2024-06-02 — End: 2024-06-02

## 2024-06-02 MED ORDER — ONDANSETRON HCL 4 MG/2ML IJ SOLN
INTRAMUSCULAR | Status: DC | PRN
  Administered 2024-06-02: 4 mg via INTRAVENOUS

## 2024-06-02 MED ORDER — THROMBIN 20000 UNITS EX SOLR: OROMUCOSAL | Status: DC | PRN

## 2024-06-02 MED ORDER — INSULIN REGULAR(HUMAN) IN NACL 100-0.9 UT/100ML-% IV SOLN: INTRAVENOUS | Status: DC

## 2024-06-02 MED ORDER — CEFAZOLIN SODIUM-DEXTROSE 2-4 GM/100ML-% IV SOLN
2.0000 g | Freq: Three times a day (TID) | INTRAVENOUS | Status: AC
  Administered 2024-06-02 – 2024-06-04 (×6): 2 g via INTRAVENOUS
  Filled 2024-06-02 (×5): qty 100

## 2024-06-02 MED ORDER — SODIUM CHLORIDE 0.9 % IV SOLN: 250.0000 mL | INTRAVENOUS | Status: DC

## 2024-06-02 MED ORDER — ALBUMIN HUMAN 5 % IV SOLN
12.5000 g | Freq: Once | INTRAVENOUS | Status: AC
  Administered 2024-06-02: 12.5 g via INTRAVENOUS

## 2024-06-02 MED ORDER — ASPIRIN 325 MG PO TBEC
325.0000 mg | DELAYED_RELEASE_TABLET | Freq: Every day | ORAL | Status: DC
  Administered 2024-06-03: 325 mg via ORAL
  Filled 2024-06-02: qty 1

## 2024-06-02 MED ORDER — HEPARIN SODIUM (PORCINE) 1000 UNIT/ML IJ SOLN
INTRAMUSCULAR | Status: AC
  Filled 2024-06-02: qty 1

## 2024-06-02 MED ORDER — NITROGLYCERIN IN D5W 200-5 MCG/ML-% IV SOLN: 0.0000 ug/min | INTRAVENOUS | Status: DC

## 2024-06-02 MED ORDER — MIDAZOLAM HCL 2 MG/2ML IJ SOLN
2.0000 mg | INTRAMUSCULAR | Status: DC | PRN
  Administered 2024-06-02: 2 mg via INTRAVENOUS
  Filled 2024-06-02: qty 2

## 2024-06-02 MED ORDER — METOPROLOL TARTRATE 12.5 MG HALF TABLET
12.5000 mg | ORAL_TABLET | Freq: Two times a day (BID) | ORAL | Status: DC
  Administered 2024-06-03: 12.5 mg via ORAL
  Filled 2024-06-02: qty 1

## 2024-06-02 MED ORDER — CHLORHEXIDINE GLUCONATE 4 % EX SOLN: 30.0000 mL | CUTANEOUS | Status: DC

## 2024-06-02 MED ORDER — PHENYLEPHRINE HCL-NACL 20-0.9 MG/250ML-% IV SOLN: 0.0000 ug/min | INTRAVENOUS | Status: DC

## 2024-06-02 MED ORDER — SODIUM CHLORIDE 0.9 % IV SOLN: 10.0000 mL/h | Freq: Once | INTRAVENOUS | Status: DC

## 2024-06-02 MED ORDER — SODIUM CHLORIDE 0.9% FLUSH
3.0000 mL | INTRAVENOUS | Status: AC | PRN
Start: 2024-06-03 — End: ?

## 2024-06-02 MED ORDER — ONDANSETRON HCL 4 MG/2ML IJ SOLN
4.0000 mg | Freq: Four times a day (QID) | INTRAMUSCULAR | Status: DC | PRN
  Administered 2024-06-03: 4 mg via INTRAVENOUS
  Filled 2024-06-02: qty 2

## 2024-06-02 MED ORDER — FENTANYL CITRATE (PF) 100 MCG/2ML IJ SOLN
INTRAMUSCULAR | Status: DC | PRN
  Administered 2024-06-02 (×2): 100 ug via INTRAVENOUS
  Administered 2024-06-02: 450 ug via INTRAVENOUS
  Administered 2024-06-02: 50 ug via INTRAVENOUS
  Administered 2024-06-02 (×4): 100 ug via INTRAVENOUS

## 2024-06-02 MED ORDER — ROCURONIUM BROMIDE 10 MG/ML (PF) SYRINGE
PREFILLED_SYRINGE | INTRAVENOUS | Status: DC | PRN
Start: 2024-06-02 — End: 2024-06-02
  Administered 2024-06-02: 20 mg via INTRAVENOUS
  Administered 2024-06-02: 70 mg via INTRAVENOUS
  Administered 2024-06-02: 20 mg via INTRAVENOUS
  Administered 2024-06-02: 50 mg via INTRAVENOUS
  Administered 2024-06-02 (×2): 30 mg via INTRAVENOUS

## 2024-06-02 MED ORDER — ROSUVASTATIN CALCIUM 20 MG PO TABS
20.0000 mg | ORAL_TABLET | Freq: Every day | ORAL | Status: DC
  Administered 2024-06-02 – 2024-06-07 (×6): 20 mg via ORAL
  Filled 2024-06-02 (×6): qty 1

## 2024-06-02 MED ORDER — BISACODYL 10 MG RE SUPP: 10.0000 mg | Freq: Every day | RECTAL | Status: DC

## 2024-06-02 MED ORDER — SODIUM CHLORIDE 0.9% FLUSH: 10.0000 mL | INTRAVENOUS | Status: DC | PRN

## 2024-06-02 MED ORDER — HEPARIN SODIUM (PORCINE) 1000 UNIT/ML IJ SOLN
INTRAMUSCULAR | Status: DC | PRN
  Administered 2024-06-02: 27000 [IU] via INTRAVENOUS

## 2024-06-02 MED ORDER — LACTATED RINGERS IV SOLN: INTRAVENOUS | Status: AC

## 2024-06-02 MED ORDER — PANTOPRAZOLE SODIUM 40 MG IV SOLR
40.0000 mg | Freq: Every day | INTRAVENOUS | Status: AC
  Administered 2024-06-02 – 2024-06-03 (×2): 40 mg via INTRAVENOUS
  Filled 2024-06-02 (×2): qty 10

## 2024-06-02 MED ORDER — CALCIUM GLUCONATE-NACL 2-0.675 GM/100ML-% IV SOLN
2.0000 g | Freq: Once | INTRAVENOUS | Status: AC
  Administered 2024-06-02: 2000 mg via INTRAVENOUS
  Filled 2024-06-02: qty 100

## 2024-06-02 MED ORDER — LACTATED RINGERS IV SOLN
INTRAVENOUS | Status: AC
Start: 2024-06-02 — End: 2024-06-03

## 2024-06-02 MED ORDER — ALBUMIN HUMAN 5 % IV SOLN
250.0000 mL | INTRAVENOUS | Status: DC | PRN
  Administered 2024-06-02 (×2): 12.5 g via INTRAVENOUS
  Filled 2024-06-02: qty 250

## 2024-06-02 MED ORDER — EPHEDRINE SULFATE (PRESSORS) 50 MG/ML IJ SOLN
INTRAMUSCULAR | Status: DC | PRN
Start: 2024-06-02 — End: 2024-06-02
  Administered 2024-06-02: 5 mg via INTRAVENOUS

## 2024-06-02 MED ORDER — PLASMA-LYTE A IV SOLN
INTRAVENOUS | Status: DC | PRN
  Administered 2024-06-02: 500 mL via INTRAVASCULAR

## 2024-06-02 MED ORDER — ROCURONIUM BROMIDE 10 MG/ML (PF) SYRINGE
PREFILLED_SYRINGE | INTRAVENOUS | Status: AC
  Filled 2024-06-02: qty 30

## 2024-06-02 MED ORDER — PLASMA-LYTE A IV SOLN
INTRAVENOUS | Status: AC
  Administered 2024-06-02: 500 mL
  Filled 2024-06-02: qty 2.5

## 2024-06-02 MED ORDER — MIDAZOLAM HCL (PF) 10 MG/2ML IJ SOLN
INTRAMUSCULAR | Status: AC
  Filled 2024-06-02: qty 2

## 2024-06-02 MED ORDER — LACTATED RINGERS IV SOLN: INTRAVENOUS | Status: DC | PRN

## 2024-06-02 MED ORDER — PROTAMINE SULFATE 10 MG/ML IV SOLN
INTRAVENOUS | Status: AC
  Filled 2024-06-02: qty 5

## 2024-06-02 MED ORDER — EPINEPHRINE HCL 5 MG/250ML IV SOLN IN NS: 0.0000 ug/min | INTRAVENOUS | Status: DC

## 2024-06-02 MED ORDER — PROPOFOL 10 MG/ML IV BOLUS
INTRAVENOUS | Status: DC | PRN
Start: 2024-06-02 — End: 2024-06-02
  Administered 2024-06-02: 20 mg via INTRAVENOUS

## 2024-06-02 MED ORDER — ASPIRIN 81 MG PO CHEW
324.0000 mg | CHEWABLE_TABLET | Freq: Once | ORAL | Status: AC
  Administered 2024-06-02: 324 mg via ORAL
  Filled 2024-06-02: qty 4

## 2024-06-02 MED ORDER — POTASSIUM CHLORIDE 10 MEQ/50ML IV SOLN
10.0000 meq | INTRAVENOUS | Status: AC
  Administered 2024-06-02 (×3): 10 meq via INTRAVENOUS

## 2024-06-02 MED ORDER — CHLORHEXIDINE GLUCONATE 0.12 % MT SOLN
15.0000 mL | OROMUCOSAL | Status: AC
  Administered 2024-06-02: 15 mL via OROMUCOSAL
  Filled 2024-06-02: qty 15

## 2024-06-02 MED ORDER — SODIUM CHLORIDE 0.9% IV SOLUTION: Freq: Once | INTRAVENOUS | Status: DC

## 2024-06-02 MED ORDER — DOCUSATE SODIUM 100 MG PO CAPS
200.0000 mg | ORAL_CAPSULE | Freq: Every day | ORAL | Status: DC
  Administered 2024-06-03: 200 mg via ORAL
  Filled 2024-06-02: qty 2

## 2024-06-02 MED ORDER — TRAMADOL HCL 50 MG PO TABS
50.0000 mg | ORAL_TABLET | ORAL | Status: DC | PRN
  Administered 2024-06-02 – 2024-06-07 (×6): 100 mg via ORAL
  Filled 2024-06-02 (×6): qty 2

## 2024-06-02 MED ORDER — EZETIMIBE 10 MG PO TABS
10.0000 mg | ORAL_TABLET | Freq: Every day | ORAL | Status: DC
  Administered 2024-06-02 – 2024-06-07 (×6): 10 mg via ORAL
  Filled 2024-06-02 (×6): qty 1

## 2024-06-02 MED ORDER — TAMSULOSIN HCL 0.4 MG PO CAPS
0.4000 mg | ORAL_CAPSULE | ORAL | Status: DC
Start: 2024-06-03 — End: 2024-06-08
  Administered 2024-06-03 – 2024-06-06 (×2): 0.4 mg via ORAL
  Filled 2024-06-02 (×2): qty 1

## 2024-06-02 MED ORDER — SODIUM CHLORIDE 0.9% FLUSH
10.0000 mL | Freq: Two times a day (BID) | INTRAVENOUS | Status: DC
  Administered 2024-06-03: 30 mL
  Administered 2024-06-03 – 2024-06-04 (×3): 10 mL

## 2024-06-02 MED ORDER — MAGNESIUM SULFATE 4 GM/100ML IV SOLN
4.0000 g | Freq: Once | INTRAVENOUS | Status: AC
  Administered 2024-06-02: 4 g via INTRAVENOUS
  Filled 2024-06-02: qty 100

## 2024-06-02 MED ORDER — PROPOFOL 10 MG/ML IV BOLUS
INTRAVENOUS | Status: AC
Start: 2024-06-02 — End: 2024-06-02
  Filled 2024-06-02: qty 20

## 2024-06-02 MED ORDER — SODIUM CHLORIDE 0.45 % IV SOLN: INTRAVENOUS | Status: AC | PRN

## 2024-06-02 MED ORDER — CHLORHEXIDINE GLUCONATE CLOTH 2 % EX PADS
6.0000 | MEDICATED_PAD | Freq: Every day | CUTANEOUS | Status: DC
  Administered 2024-06-03 – 2024-06-06 (×4): 6 via TOPICAL

## 2024-06-02 MED ORDER — ACETAMINOPHEN 500 MG PO TABS
1000.0000 mg | ORAL_TABLET | Freq: Four times a day (QID) | ORAL | Status: AC
  Administered 2024-06-02 – 2024-06-07 (×15): 1000 mg via ORAL
  Filled 2024-06-02 (×14): qty 2

## 2024-06-02 MED ORDER — PROTAMINE SULFATE 10 MG/ML IV SOLN
INTRAVENOUS | Status: DC | PRN
  Administered 2024-06-02: 25 mg via INTRAVENOUS
  Administered 2024-06-02: 120 mg via INTRAVENOUS
  Administered 2024-06-02: 50 mg via INTRAVENOUS
  Administered 2024-06-02: 130 mg via INTRAVENOUS
  Administered 2024-06-02: 20 mg via INTRAVENOUS
  Administered 2024-06-02: 50 mg via INTRAVENOUS

## 2024-06-02 MED ORDER — VANCOMYCIN HCL IN DEXTROSE 1-5 GM/200ML-% IV SOLN
1000.0000 mg | Freq: Once | INTRAVENOUS | Status: AC
  Administered 2024-06-02: 1000 mg via INTRAVENOUS
  Filled 2024-06-02: qty 200

## 2024-06-02 MED ORDER — PHENYLEPHRINE HCL (PRESSORS) 10 MG/ML IV SOLN
INTRAVENOUS | Status: AC
  Filled 2024-06-02: qty 1

## 2024-06-02 MED ORDER — CHLORHEXIDINE GLUCONATE 0.12 % MT SOLN
15.0000 mL | Freq: Once | OROMUCOSAL | Status: AC
  Administered 2024-06-02: 15 mL via OROMUCOSAL

## 2024-06-02 MED ORDER — DEXTROSE 50 % IV SOLN: 0.0000 mL | INTRAVENOUS | Status: DC | PRN

## 2024-06-02 MED ORDER — CHLORHEXIDINE GLUCONATE CLOTH 2 % EX PADS: 6.0000 | MEDICATED_PAD | Freq: Every day | CUTANEOUS | Status: DC

## 2024-06-02 MED ORDER — BISACODYL 5 MG PO TBEC
10.0000 mg | DELAYED_RELEASE_TABLET | Freq: Every day | ORAL | Status: DC
  Administered 2024-06-03: 10 mg via ORAL
  Filled 2024-06-02: qty 2

## 2024-06-02 MED ORDER — METOPROLOL TARTRATE 5 MG/5ML IV SOLN: 2.5000 mg | INTRAVENOUS | Status: DC | PRN

## 2024-06-02 MED ORDER — THROMBIN (RECOMBINANT) 20000 UNITS EX SOLR
CUTANEOUS | Status: AC
  Filled 2024-06-02: qty 20000

## 2024-06-02 MED ORDER — HEMOSTATIC AGENTS (NO CHARGE) OPTIME
TOPICAL | Status: DC | PRN
  Administered 2024-06-02 (×2): 1 via TOPICAL

## 2024-06-02 MED ORDER — DEXMEDETOMIDINE HCL IN NACL 400 MCG/100ML IV SOLN
0.0000 ug/kg/h | INTRAVENOUS | Status: DC
  Administered 2024-06-02: 0.7 ug/kg/h via INTRAVENOUS
  Filled 2024-06-02: qty 100

## 2024-06-02 MED ORDER — ORAL CARE MOUTH RINSE: 15.0000 mL | OROMUCOSAL | Status: DC | PRN

## 2024-06-02 MED ORDER — HYDROXYZINE HCL 25 MG PO TABS: 25.0000 mg | ORAL_TABLET | ORAL | Status: DC | PRN

## 2024-06-02 MED ORDER — PANTOPRAZOLE SODIUM 40 MG PO TBEC: 40.0000 mg | DELAYED_RELEASE_TABLET | Freq: Every day | ORAL | Status: DC

## 2024-06-02 MED ORDER — METOCLOPRAMIDE HCL 5 MG/ML IJ SOLN
10.0000 mg | Freq: Four times a day (QID) | INTRAMUSCULAR | Status: AC
  Administered 2024-06-02 – 2024-06-03 (×5): 10 mg via INTRAVENOUS
  Filled 2024-06-02 (×6): qty 2

## 2024-06-02 MED ORDER — MORPHINE SULFATE (PF) 2 MG/ML IV SOLN
1.0000 mg | INTRAVENOUS | Status: DC | PRN
  Administered 2024-06-02: 4 mg via INTRAVENOUS
  Administered 2024-06-02 – 2024-06-03 (×2): 2 mg via INTRAVENOUS
  Administered 2024-06-03 (×2): 4 mg via INTRAVENOUS
  Filled 2024-06-02: qty 2
  Filled 2024-06-02 (×2): qty 1
  Filled 2024-06-02 (×2): qty 2

## 2024-06-02 MED ORDER — ALBUTEROL SULFATE (2.5 MG/3ML) 0.083% IN NEBU: 2.5000 mg | INHALATION_SOLUTION | Freq: Four times a day (QID) | RESPIRATORY_TRACT | Status: DC | PRN

## 2024-06-02 MED ORDER — SODIUM CHLORIDE 0.9 % IV SOLN: INTRAVENOUS | Status: DC

## 2024-06-02 MED ORDER — SODIUM CHLORIDE 0.9% FLUSH
3.0000 mL | Freq: Two times a day (BID) | INTRAVENOUS | Status: DC
  Administered 2024-06-03 – 2024-06-04 (×4): 3 mL via INTRAVENOUS

## 2024-06-02 MED ORDER — METOPROLOL TARTRATE 25 MG/10 ML ORAL SUSPENSION: 12.5000 mg | Freq: Two times a day (BID) | ORAL | Status: DC

## 2024-06-02 MED ORDER — ~~LOC~~ CARDIAC SURGERY, PATIENT & FAMILY EDUCATION
Freq: Once | Status: DC
  Filled 2024-06-02: qty 1

## 2024-06-02 SURGICAL SUPPLY — 85 items
ADAPTER MULTI PERFUSION 15 (ADAPTER) ×2 IMPLANT
BAG DECANTER FOR FLEXI CONT (MISCELLANEOUS) ×2 IMPLANT
BLADE CLIPPER SURG (BLADE) ×2 IMPLANT
BLADE STERNUM SYSTEM 6 (BLADE) ×2 IMPLANT
BNDG ELASTIC 4INX 5YD STR LF (GAUZE/BANDAGES/DRESSINGS) IMPLANT
BNDG ELASTIC 6INX 5YD STR LF (GAUZE/BANDAGES/DRESSINGS) ×2 IMPLANT
BNDG GAUZE DERMACEA FLUFF 4 (GAUZE/BANDAGES/DRESSINGS) ×2 IMPLANT
CANISTER SUCTION 3000ML PPV (SUCTIONS) ×2 IMPLANT
CANNULA AORTIC ROOT 9FR (CANNULA) ×2 IMPLANT
CANNULA GUNDRY RETROGRADE 15FR (MISCELLANEOUS) IMPLANT
CANNULA MC2 2 STG 36/46 NON-V (CANNULA) IMPLANT
CANNULA NON VENT 20FR 12 (CANNULA) IMPLANT
CANNULA VESSEL 3MM BLUNT TIP (CANNULA) ×2 IMPLANT
CATH ROBINSON RED A/P 18FR (CATHETERS) ×4 IMPLANT
CATH THOR RT ANG 28F 9128 SOFT (CATHETERS) ×2 IMPLANT
CATH THORACIC 28FR (CATHETERS) ×2 IMPLANT
CATH THORACIC 36FR (CATHETERS) ×2 IMPLANT
CLIP TI MEDIUM 24 (CLIP) IMPLANT
CLIP TI WIDE RED SMALL 24 (CLIP) IMPLANT
CONTAINER PROTECT SURGISLUSH (MISCELLANEOUS) ×4 IMPLANT
DERMABOND ADVANCED .7 DNX6 (GAUZE/BANDAGES/DRESSINGS) IMPLANT
DRAPE SRG 135X102X78XABS (DRAPES) ×2 IMPLANT
DRAPE WARM FLUID 44X44 (DRAPES) ×2 IMPLANT
DRSG COVADERM 4X14 (GAUZE/BANDAGES/DRESSINGS) ×2 IMPLANT
ELECTRODE BLDE 4.0 EZ CLN MEGD (MISCELLANEOUS) IMPLANT
ELECTRODE REM PT RTRN 9FT ADLT (ELECTROSURGICAL) ×4 IMPLANT
FELT TEFLON 1X6 (MISCELLANEOUS) ×2 IMPLANT
GAUZE SPONGE 4X4 12PLY STRL (GAUZE/BANDAGES/DRESSINGS) ×4 IMPLANT
GLOVE BIOGEL PI IND STRL 7.0 (GLOVE) ×4 IMPLANT
GLOVE BIOGEL PI MICRO STRL 7 (GLOVE) ×6 IMPLANT
GOWN STRL REUS W/ TWL LRG LVL3 (GOWN DISPOSABLE) ×10 IMPLANT
GOWN STRL REUS W/ TWL XL LVL3 (GOWN DISPOSABLE) ×4 IMPLANT
HEMOSTAT POWDER SURGIFOAM 1G (HEMOSTASIS) ×6 IMPLANT
HEMOSTAT SURGICEL 2X14 (HEMOSTASIS) ×2 IMPLANT
KIT BASIN OR (CUSTOM PROCEDURE TRAY) ×2 IMPLANT
KIT SUCTION CATH 14FR (SUCTIONS) ×2 IMPLANT
KIT TURNOVER KIT B (KITS) ×2 IMPLANT
KIT VASOVIEW HEMOPRO 2 VH 4000 (KITS) ×2 IMPLANT
MARKER GRAFT CORONARY BYPASS (MISCELLANEOUS) ×6 IMPLANT
PACK E OPEN HEART (SUTURE) ×2 IMPLANT
PACK OPEN HEART (CUSTOM PROCEDURE TRAY) ×2 IMPLANT
PAD ARMBOARD POSITIONER FOAM (MISCELLANEOUS) ×4 IMPLANT
PAD ELECT DEFIB RADIOL ZOLL (MISCELLANEOUS) ×2 IMPLANT
PENCIL BUTTON HOLSTER BLD 10FT (ELECTRODE) ×2 IMPLANT
POSITIONER HEAD DONUT 9IN (MISCELLANEOUS) ×2 IMPLANT
POWDER SURGICEL 3.0 GRAM (HEMOSTASIS) IMPLANT
PUNCH AORTIC ROTATE 4.0MM (MISCELLANEOUS) IMPLANT
PUNCH AORTIC ROTATE 4.5MM 8IN (MISCELLANEOUS) IMPLANT
PUNCH AORTIC ROTATE 5MM 8IN (MISCELLANEOUS) IMPLANT
SET MPS 3-ND DEL (MISCELLANEOUS) IMPLANT
SOLN 0.9% NACL 1000 ML (IV SOLUTION) ×12 IMPLANT
SOLN 0.9% NACL POUR BTL 1000ML (IV SOLUTION) ×10 IMPLANT
SOLN STERILE WATER 1000 ML (IV SOLUTION) ×4 IMPLANT
SOLN STERILE WATER BTL 1000 ML (IV SOLUTION) ×4 IMPLANT
SPONGE LAP 18X18 X RAY DECT (DISPOSABLE) IMPLANT
STOPCOCK 4 WAY LG BORE MALE ST (IV SETS) IMPLANT
SUPPORT HEART JANKE-BARRON (MISCELLANEOUS) ×2 IMPLANT
SUT BONE WAX W31G (SUTURE) ×2 IMPLANT
SUT ETHIBOND X763 2 0 SH 1 (SUTURE) ×4 IMPLANT
SUT MNCRL AB 4-0 PS2 18 (SUTURE) IMPLANT
SUT PROLENE 4 0 SH DA (SUTURE) ×2 IMPLANT
SUT PROLENE 4-0 RB1 .5 CRCL 36 (SUTURE) IMPLANT
SUT PROLENE 6 0 C 1 30 (SUTURE) ×4 IMPLANT
SUT PROLENE 7 0 BV 1 (SUTURE) IMPLANT
SUT PROLENE 7 0 BV1 MDA (SUTURE) ×2 IMPLANT
SUT SILK 1 MH (SUTURE) IMPLANT
SUT SILK 2 0SH CR/8 30 (SUTURE) ×2 IMPLANT
SUT STEEL 6MS V (SUTURE) ×2 IMPLANT
SUT STEEL STERNAL CCS#1 18IN (SUTURE) IMPLANT
SUT STEEL SZ 6 DBL 3X14 BALL (SUTURE) ×4 IMPLANT
SUT VIC AB 1 CTX36XBRD ANBCTR (SUTURE) ×4 IMPLANT
SUT VIC AB 2-0 CT1 36 (SUTURE) IMPLANT
SUT VIC AB 2-0 CT1 TAPERPNT 27 (SUTURE) IMPLANT
SUT VIC AB 2-0 CTX 27 (SUTURE) IMPLANT
SYSTEM SAHARA CHEST DRAIN ATS (WOUND CARE) ×2 IMPLANT
TAPE CLOTH SURG 4X10 WHT LF (GAUZE/BANDAGES/DRESSINGS) IMPLANT
TAPE PAPER 2X10 WHT MICROPORE (GAUZE/BANDAGES/DRESSINGS) IMPLANT
TOWEL GREEN STERILE (TOWEL DISPOSABLE) ×2 IMPLANT
TOWEL GREEN STERILE FF (TOWEL DISPOSABLE) ×2 IMPLANT
TRAY FOLEY SLVR 16FR TEMP STAT (SET/KITS/TRAYS/PACK) ×2 IMPLANT
TUBE CONNECTING 20X1/4 (TUBING) IMPLANT
TUBE SUCT INTRACARD DLP 20F (MISCELLANEOUS) ×2 IMPLANT
TUBE SUCTION CARDIAC 10FR (CANNULA) ×2 IMPLANT
TUBING LAP HI FLOW INSUFFLATIO (TUBING) ×2 IMPLANT
UNDERPAD 30X36 HEAVY ABSORB (UNDERPADS AND DIAPERS) ×2 IMPLANT

## 2024-06-02 NOTE — Discharge Summary (Signed)
 [address] [place] [address]             [phone]        Physician Discharge Summary  Patient ID: Dustin Sloan MRN: [id number] DOB/AGE: [birth year] 73 y.o.  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   Coronary artery disease 06/02/2024   History of kidney stones    Pre-diabetes    Sleep apnea    CAD (coronary artery disease) 05/07/2024   Abnormal stress test 04/15/2024   Testicular hypofunction    Inflammatory dermatosis    Impaired cognition    Hyperlipidemia    Essential hypertension    Benign prostatic hyperplasia    Arthritis    Fatigue    Kidney stone    Shortness of breath    Abnormal urine 07/22/2023   Encounter for screening for cardiovascular disorders 07/22/2023   Other specified health status 04/11/2023   Spasm 07/09/2022   Electrocardiogram abnormal 03/29/2022   Encounter for examination of eyes and vision without abnormal findings 03/29/2022   Body mass index (BMI) 24.0-24.9, adult 12/27/2021   Disequilibrium 09/26/2021   Blood in urine 08/08/2021   Dizziness 08/23/2020   Unstable angina pectoris (HCC) 08/23/2020   Chest pain 08/23/2020   Asymptomatic varicose veins of both lower extremities 08/23/2020   Vitamin D deficiency    Low testosterone    Hypertension    Allergies    Tight heel cords, acquired, bilateral 04/15/2020   Capsulitis of metatarsophalangeal (MTP) joint of right foot 04/14/2020   Pain in left foot 12/17/2019   Pain in right foot 12/17/2019   Body mass index (BMI) 27.0-27.9, adult 02/24/2019     Discharge Diagnoses:  Patient Active Problem List   Diagnosis Date Noted   AKI (acute kidney injury) 06/04/2024   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   Coronary artery disease 06/02/2024   History of kidney stones    Pre-diabetes    Sleep apnea    CAD  (coronary artery disease) 05/07/2024   Abnormal stress test 04/15/2024   Testicular hypofunction    Inflammatory dermatosis    Impaired cognition    Hyperlipidemia    Essential hypertension    Benign prostatic hyperplasia    Arthritis    Fatigue    Kidney stone    Shortness of breath    Abnormal urine 07/22/2023   Encounter for screening for cardiovascular disorders 07/22/2023   Other specified health status 04/11/2023   Spasm 07/09/2022   Electrocardiogram abnormal 03/29/2022   Encounter for examination of eyes and vision without abnormal findings 03/29/2022   Body mass index (BMI) 24.0-24.9, adult 12/27/2021   Disequilibrium 09/26/2021   Blood in urine 08/08/2021   Dizziness 08/23/2020   Unstable angina pectoris (HCC) 08/23/2020   Chest pain 08/23/2020   Asymptomatic varicose veins of both lower extremities 08/23/2020   Vitamin D deficiency    Low testosterone    Hypertension    Allergies    Tight heel cords, acquired, bilateral 04/15/2020   Capsulitis of metatarsophalangeal (MTP) joint of right foot 04/14/2020   Pain in left foot 12/17/2019   Pain in right foot 12/17/2019   Body mass index (BMI) 27.0-27.9, adult 02/24/2019   Discharged Condition: Stable  HPI: This is a 73 y.o. male who presented to the  office on 05/28/2024 for surgical evaluation of multivessel coronary artery disease.  The patient is very anxious and has reports progressive fatigue and shortness of breath that has worsened.  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  He reports chest pain that feels like a tremble but that it comes and goes.  He has never had a stroke but has a son who has had 2 strokes.  He reports orthopnea (although likely anxiety related) and sometimes gets dizzy.  He has also had a lot of kidney stones in the past. He has never smoked and does not use alcohol.  Dr. Daniel discussed the need for coronary artery bypass grafting surgery.  Potential risks, benefits, and complications of the surgery were discussed with the patient and he agreed to proceed with surgery. Pre operative carotid US  showed no significant internal carotid artery stenosis bilaterally.  Hospital Course: Patient underwent a CABG x 4. He was transported from the OR to [facility] ICU in stable condition. He was extubated late the evening of surgery. He did receive multiple units (3 units) of PRBCs in the OR. He was weaned off Epinephrine and Nor epinephrine drips.He was weaned off the Insulin drip. His pre op HGA1C  5.5. Accu checks and SS PRN will be stopped after transfer to the floor. He was above his pre op weight and diuresed accordingly. He had complaints of dysphagia on POD 2 so speech evaluated him. Recommendations were followed accordingly (regular, thin liquid diet, eating must be done upright, etc).He was above his pre op weight and was diuresed accordingly, with careful monitoring of his creatinine as he had AKI post op. He had both expected blood loss anemia and thrombocytopenia from CPB and coagulopathy post op. His platelet count went as low as 74,000 but did gradually improve. Foley, EPW, and remaining chest tube were removed on 10/17. He was stable for transfer from the ICU to 4E on 10/18.  He did develop some bursts of atrial fibrillation/SVT as well as frequent PVCs and was started on oral amiodarone as well as p.o. metoprolol . The arrhythmias resolved before the amiodarone was adequately loaded so as to be effective so it was discontinued.  He developed AKI early post-op but renal function was returning to pre-op baseline by the time of discharge. By the  6th post-op day, [name] was tolerating the cardiac diet with no difficulty and was independent with mobility and transfers. His incisions were healing with no sign of complication. He was maintaining adequate O2 saturations on room air. He felt he was ready for discharge to home.   Consults:  pulmonary/intensive care  Significant Diagnostic Studies:  CLINICAL DATA:  Pneumothorax.   EXAM: PORTABLE CHEST 1 VIEW   COMPARISON:  06/05/2024   FINDINGS: Low volume film. The cardio pericardial silhouette is enlarged. Similar left base collapse/consolidation. Improved aeration at the right base with decreasing atelectasis. Right IJ central line has been removed in the interval. Slight increase in vascular congestion without overt airspace pulmonary edema. Telemetry leads overlie the chest.   IMPRESSION: 1. No discernible pneumothorax on the current study. 2. Low volume film with persistent left base collapse/consolidation. 3. Improved aeration at the right base with decreasing atelectasis. 4. Slight increase in vascular congestion without overt airspace pulmonary edema.     Electronically Signed   By: Camellia Candle M.D.   On: 06/06/2024 06:57    Treatments: surgery:  1. CABG x 4 (SVG -  PDA, SVG - OM, SVG - diag, LIMA - LAD) 2. Endoscopic Vein Harvest by Dr. Daniel on 06/02/2024.  Discharge Exam: Blood pressure 115/77, pulse 79, temperature 98.2 F (36.8 C), resp. rate 20, height 5' 5 (1.651 m), weight 67.4 kg, SpO2 98%.  General appearance: alert, cooperative, and no distress Neurologic: intact Heart: RRR. Monitor showing NSR with few PVC's and PAC's Lungs: respirations unlabored, breath sounds clear. Stable sats on RA.  Abdomen: soft, no tenderness.  Extremities: no peripheral edema, has expected bruising at bilateral LE vein harvest incisions. No palpable hematomas.  Wound: The sternotomy incision and chest tube sites are intact and dry.    Discharge Medications:  The patient has been discharged on:   1.Beta Blocker:  Yes [ x  ]                              No   [   ]                              If No, reason:  2.Ace Inhibitor/ARB: Yes [   ]                                     No  [  x  ]                                     If No, reason: Soft  BP  3.Statin:   Yes [  x ]                  No  [   ]                  If No, reason:  4.Ecasa:  Yes  [  x ]                  No   [   ]                  If No, reason:  Patient had ACS upon admission:  No  Plavix/P2Y12 inhibitor: Yes [   ]                                      No  [  x ]      Allergies as of 06/08/2024       Reactions   Lipitor [atorvastatin  Calcium ] Other (See Comments)   Myalgia    Oxycodone-acetaminophen Nausea And Vomiting        Medication List     STOP taking these medications    amLODipine 5 MG tablet Commonly known as: NORVASC   losartan-hydrochlorothiazide 50-12.5 MG tablet Commonly known as: HYZAAR       TAKE these medications    albuterol 108 (90 Base) MCG/ACT inhaler Commonly known as: VENTOLIN HFA Inhale 2 puffs into the lungs every 6 (six) hours as needed for wheezing or shortness of breath.   aspirin  EC 81 MG tablet Take 1 tablet (81 mg total) by mouth daily. Swallow whole.   ezetimibe 10 MG tablet Commonly known as: ZETIA Take 10  mg by mouth daily in the afternoon.   furosemide 20 MG tablet Commonly known as: LASIX Take 2 tablets (40 mg total) by mouth in the morning. For 5 days then decrease the dose to 1 tablet daily.   gabapentin 100 MG capsule Commonly known as: NEURONTIN Take 1 capsule (100 mg total) by mouth 2 (two) times daily.   Garlic 300 MG Caps Take 300 mg by mouth every evening.   hydrOXYzine 25 MG tablet Commonly known as: ATARAX Take 25 mg by mouth every other day as needed (sleep).   meclizine 25 MG tablet Commonly known as: ANTIVERT Take 25 mg by mouth 3 (three) times daily as needed for dizziness.   metoprolol  succinate 25 MG 24 hr tablet Commonly known as: Toprol  XL Take 1 tablet (25 mg total) by mouth daily.   minocycline 100 MG capsule Commonly known as: MINOCIN Take 100 mg by mouth daily as needed (skin blemishes).   multivitamin tablet Take 1 tablet by mouth in the morning.    potassium chloride SA 20 MEQ tablet Commonly known as: KLOR-CON M Take 1 tablet (20 mEq total) by mouth daily.   rosuvastatin  20 MG tablet Commonly known as: Crestor  Take 1 tablet (20 mg total) by mouth daily. What changed: when to take this   tamsulosin 0.4 MG Caps capsule Commonly known as: FLOMAX Take 0.4 mg by mouth every 3 (three) days. Patient takes this med at night   traMADol 50 MG tablet Commonly known as: ULTRAM Take 1 tablet (50 mg total) by mouth every 6 (six) hours as needed for up to 7 days for moderate pain (pain score 4-6).   triamcinolone cream 0.1 % Commonly known as: KENALOG Apply 1 application  topically 2 (two) times daily as needed (skin rash/irritation.).   Vitamin D-3 125 MCG (5000 UT) Tabs Take 5,000 Units by mouth in the morning.        Follow-up Information     Su, Con RAMAN, MD. Go on 06/18/2024.   Specialty: Cardiothoracic Surgery Why: Please arrive by 3:30 pm in order to have PA/LAT CXR taken PRIOR to office appointment with Dr. Daniel. CXR located in the same building as Dr. Linward office on the SECOND floor. Appointment time is at 4:30 pm Contact information: [address] 4th Floor [place] [address] [phone]         Liborio Alean SAUNDERS, MD. Go on 06/22/2024.   Specialty: Cardiology Why: Appointment time is at 9:00 am Contact information: [address] [place] [address] [phone]                 Signed:  Laurel JUDITHANN Becket, PA-C 06/08/2024, 9:29 AM

## 2024-06-02 NOTE — Procedures (Signed)
 Extubation Procedure Note  Pt extubated after successful Cardiac wean. NIF -28, VC 650 mls, ABG within acceptable range, No stridor, Placed on 6L [place].   Patient Details:   Name: Rasean Joos DOB: 1951-02-21 MRN: [id number]   Airway Documentation:    Vent end date: 06/02/24 Vent end time: 2248   Evaluation  O2 sats: stable throughout Complications: No apparent complications Patient did tolerate procedure well. Bilateral Breath Sounds: Fine crackles   Yes  Toribio Suzen BROCKS 06/02/2024, 10:51 PM

## 2024-06-02 NOTE — Anesthesia Procedure Notes (Signed)
 Central Venous Catheter Insertion Performed by: Leonce Athens, MD, anesthesiologist Start/End10/14/2025 6:46 AM, 06/02/2024 7:02 AM Patient location: Pre-op. Preanesthetic checklist: patient identified, IV checked, risks and benefits discussed, surgical consent, monitors and equipment checked, pre-op evaluation, timeout performed and anesthesia consent Position: Trendelenburg Lidocaine 1% used for infiltration and patient sedated Hand hygiene performed , maximum sterile barriers used  and Seldinger technique used Catheter size: 8.5 Fr Central line was placed.Sheath introducer Procedure performed using ultrasound to evaluate access site. Ultrasound Notes:relevant anatomy identified, ultrasound used to visualize needle entry, vessel patent under ultrasound and image(s) printed for medical record. Attempts: 1 Following insertion, line sutured, dressing applied and Biopatch. Post procedure assessment: free fluid flow, blood return through all ports and no air  Patient tolerated the procedure well with no immediate complications. Additional procedure comments: CVP: Timeout, sterile prep, drape, FBP R neck.  Trendelenburg position.  1% lido local, finder and trocar RIJ 1st pass with US  guidance.  Cordis introducer placed over J wire, Triple lumen inserted through introducer. Biopatch and sterile dressing on.  Patient tolerated well.  VSS.  Dustin Sloan Leonce, MD.

## 2024-06-02 NOTE — Brief Op Note (Signed)
 06/02/2024  2:26 PM  PATIENT:  Dustin Sloan  73 y.o. male  PRE-OPERATIVE DIAGNOSIS:  CORONARY ARTERY DISEASE  POST-OPERATIVE DIAGNOSIS:  CORONARY ARTERY DISEASE  PROCEDURE:  INTRAOPERATIVE TRANSESOPHAGEAL ECHOCARDIOGRAM, CORONARY ARTERY BYPASS GRAFTING X 4 (LIMA to LAD, SVG to DIAGONAL, SVG to OM, SVG to PDA) USING LEFT INTERNAL MAMMARY ARTERY AND ENDOSCOPIC HARVESTED RIGHT AND LEFT GREATER SAPHENOUS VEIN   Vein harvest time: 70 min Vein prep time: 30 min  SURGEON:  Surgeons and Role:    Daniel Con RAMAN, MD - Primary  PHYSICIAN ASSISTANT: Kyla Donald PA-C  ASSISTANTS: Jerel Fees RNFA   ANESTHESIA:   general  EBL:  Per anesthesia, perfusion record  BLOOD ADMINISTERED:Two CC PRBC, two FFP, and one PLTS and Cryoprecipitate  DRAINS: Chest tubes placed in the mediastinal and pleural spaces   COUNTS CORRECT:  YES  DICTATION: .Dragon Dictation  PLAN OF CARE: Admit to inpatient   PATIENT DISPOSITION:  ICU - intubated and hemodynamically stable.   Delay start of Pharmacological VTE agent (>24hrs) due to surgical blood loss or risk of bleeding: yes  BASELINE WEIGHT: 68 kg

## 2024-06-02 NOTE — Progress Notes (Signed)
  Echocardiogram Echocardiogram Transesophageal has been performed.  Devora Ellouise SAUNDERS 06/02/2024, 9:31 AM

## 2024-06-02 NOTE — Anesthesia Procedure Notes (Signed)
 Procedure Name: Intubation Date/Time: 06/02/2024 7:55 AM  Performed by: Lettie Derrek Dacosta, CRNAPre-anesthesia Checklist: Patient identified, Patient being monitored, Timeout performed, Emergency Drugs available and Suction available Patient Re-evaluated:Patient Re-evaluated prior to induction Oxygen Delivery Method: Circle System Utilized Preoxygenation: Pre-oxygenation with 100% oxygen Induction Type: IV induction Ventilation: Mask ventilation without difficulty Laryngoscope Size: Mac and 4 Grade View: Grade I Tube type: Oral Tube size: 8.0 mm Number of attempts: 1 Airway Equipment and Method: Stylet Placement Confirmation: ETT inserted through vocal cords under direct vision, positive ETCO2 and breath sounds checked- equal and bilateral Secured at: 23 cm Tube secured with: Tape Dental Injury: Teeth and Oropharynx as per pre-operative assessment

## 2024-06-02 NOTE — Consult Note (Signed)
 NAME:  Dustin Sloan, MRN:  [id number], DOB:  1951/04/14, LOS: 0 ADMISSION DATE:  06/02/2024, CONSULTATION DATE:  10/14 REFERRING MD:  Dr. Daniel, [facility] CHIEF COMPLAINT:  s/p CABG  History of Present Illness:  73 year old male with past medical history of arthritis, BPH, sleep apnea not on CPAP, pre-diabetes, hypertension, hyperlipidemia with statin intolerance, CAD who presents to [facility] for CABG. Initially evaluated by cardiology for shortness of breath. He had TTE from PCP on 01/2024 with LVEF 50-55%, G1DD, trace AI, mild MR/TR. From there, was given nuclear stress test with abnormal EKG and referred for cardiac CT. 04/28/2024 had coronary CT with severe CAD, coronary calcium  score 3227. Had LHC 10/1 with severe and complex proximal LAD stenosis, high-grade OM2 stenosis and PDA. He was referred to TCTS. He had repeat echo 10/7 with LVEF 60-65%, G1DD.   Pump time: 2h 2m Xclamp time: 1h 57m EBL: 350cc (verbal report of moderate bleeding from leg on bypass, not calculated) Cell saver: 550cc UOP: 2400cc 750cc albumin, 3 PRBC, 2 cryo, 1 plt, 2900ccLR  Pertinent  Medical History  arthritis, BPH, sleep apnea not on CPAP, pre-diabetes, hypertension, hyperlipidemia with statin intolerance, CAD  Significant Hospital Events: Including procedures, antibiotic start and stop dates in addition to other pertinent events   10/14: admit post op to CCU for CABG x 4  Interim History / Subjective:  CCU post-op intubated. Ordering 2U FFP now, and 2g calcium  gluconate. Watching CT output   Objective   Blood pressure 135/88, pulse 73, temperature 98.7 F (37.1 C), temperature source Oral, resp. rate 19, height 5' 5 (1.651 m), weight 68 kg, SpO2 98%.        Intake/Output Summary (Last 24 hours) at 06/02/2024 1034 Last data filed at 06/02/2024 1008 Gross per 24 hour  Intake 1650 ml  Output 215 ml  Net 1435 ml   Filed Weights   06/02/24 0615  Weight: 68 kg    Examination: General: older male, post-op   HENT: ett, ogt, ncat, anicteric sclera  Lungs: ctab, vented, synchronous Cardiovascular: paced, +rub  Abdomen: rounded, soft, hypoactive Extremities: bilateral post-operative dressings, large hematoma to the upper inner left thigh  Neuro: sedated GU: foley, light yellow/clear urine  Resolved Hospital Problem list    Assessment & Plan:  Multivessel CAD s/p CABG x4 RSVG to PDA, OM, diagonal; LIMA to LAD Post bypass vasoplegia - 2g calcium  gluconate now based on ABG  - post-op management per TCTS - CXR/ ABG, CBC, BMET now - titrate levo, epi for MAP goal 65-85 - albumin prn  - insulin gtt, glucose checks and titration per endotool  - tele monitoring/ pacing prn - mediastinal drains per TCTS - multimodal pain control per protocol- oxycodone, tramadol, morphine with bowel regimen - aspirin  daily  - metoprolol  BID once off pressors - complete post-op antibiotics - monitor electrolytes, replete PRN   Post-op vent management  OSA not on CPAP  - rapid wean per TCTS protocol - VAP/ PPI  Expected post-operative ABLA Expected post-operative consumptive thrombocytopenia  Coagulopathy 2/2 bypass, hypothermia  Coagulopathic intraoperatively. ACT increased when trying to reverse with protamine. Received total of 3U PRBC, 2 cryo, 1 plt  - 2 FFP now  - send off coags now  - transfuse prn  - warm patient   Hypertension  Hyperlipidemia  On amlodipine, hyzaar, crestor  (also on zetia but not taking) - zetia 10mg  daily   BPH - con't home tamsulosin 0.4mg  TID   Labs   CBC: Recent Labs  Lab  06/01/24 0847 06/02/24 0803 06/02/24 0806 06/02/24 0949  WBC 5.3  --   --   --   HGB 15.4 12.2* 12.6* 12.2*  HCT 46.6 36.0* 37.0* 36.0*  MCV 96.3  --   --   --   PLT 165  --   --   --     Basic Metabolic Panel: Recent Labs  Lab 06/01/24 0847 06/02/24 0803 06/02/24 0806 06/02/24 0949  NA 140 141 142 142  K 3.9 3.8 3.8 4.0  CL 104  --  102 102  CO2 27  --   --   --   GLUCOSE 109*   --  107* 112*  BUN 10  --  12 12  CREATININE 1.04  --  0.80 0.90  CALCIUM  9.5  --   --   --    GFR: Estimated Creatinine Clearance: 64.5 mL/min (by C-G formula based on SCr of 0.9 mg/dL). Recent Labs  Lab 06/01/24 0847  WBC 5.3    Liver Function Tests: Recent Labs  Lab 06/01/24 0847  AST 26  ALT 26  ALKPHOS 55  BILITOT 0.7  PROT 7.5  ALBUMIN 4.3   No results for input(s): LIPASE, AMYLASE in the last 168 hours. No results for input(s): AMMONIA in the last 168 hours.  ABG    Component Value Date/Time   PHART 7.390 06/02/2024 0803   PCO2ART 39.4 06/02/2024 0803   PO2ART 356 (H) 06/02/2024 0803   HCO3 23.9 06/02/2024 0803   TCO2 27 06/02/2024 0949   ACIDBASEDEF 1.0 06/02/2024 0803   O2SAT 100 06/02/2024 0803     Coagulation Profile: Recent Labs  Lab 06/01/24 0847  INR 1.0    Cardiac Enzymes: No results for input(s): CKTOTAL, CKMB, CKMBINDEX, TROPONINI in the last 168 hours.  HbA1C: Hgb A1c MFr Bld  Date/Time Value Ref Range Status  06/01/2024 08:47 AM 5.5 4.8 - 5.6 % Final    Comment:    (NOTE) Diagnosis of Diabetes The following HbA1c ranges recommended by the American Diabetes Association (ADA) may be used as an aid in the diagnosis of diabetes mellitus.  Hemoglobin             Suggested A1C NGSP%              Diagnosis  <5.7                   Non Diabetic  5.7-6.4                Pre-Diabetic  >6.4                   Diabetic  <7.0                   Glycemic control for                       adults with diabetes.      CBG: No results for input(s): GLUCAP in the last 168 hours.  Review of Systems:   As above  Past Medical History:  He,  has a past medical history of Abnormal urine (07/22/2023), Allergies, Arthritis, Asymptomatic varicose veins of both lower extremities (08/23/2020), Benign prostatic hyperplasia, Blood in urine (08/08/2021), Body mass index (BMI) 24.0-24.9, adult (12/27/2021), Capsulitis of  metatarsophalangeal (MTP) joint of right foot (04/14/2020), Chest pain (08/23/2020), Disequilibrium (09/26/2021), Dizziness, Electrocardiogram abnormal (03/29/2022), Encounter for examination of eyes and vision without abnormal findings (03/29/2022), Encounter for screening for  cardiovascular disorders (07/22/2023), Essential hypertension, Fatigue, History of kidney stones, Hyperlipidemia, Hypertension, Impaired cognition, Inflammatory dermatosis, Low testosterone, Other specified health status (04/11/2023), Pain in left foot (12/17/2019), Pain in right foot (12/17/2019), Pre-diabetes, Shortness of breath, Sleep apnea, Spasm (07/09/2022), Testicular hypofunction, Tight heel cords, acquired, bilateral (04/15/2020), Unstable angina pectoris (HCC) (08/23/2020), and Vitamin D deficiency.   Surgical History:   Past Surgical History:  Procedure Laterality Date   APPENDECTOMY     2018   CATARACT EXTRACTION W/ INTRAOCULAR LENS IMPLANT Right 06/18/2023   COLONOSCOPY     EYE SURGERY     bilateral cataracts removed   HERNIA REPAIR     LITHOTRIPSY     x several   NECK SURGERY     1988, 2008   RIGHT/LEFT HEART CATH AND CORONARY ANGIOGRAPHY N/A 05/20/2024   Procedure: RIGHT/LEFT HEART CATH AND CORONARY ANGIOGRAPHY;  Surgeon: Ladona Heinz, MD;  Location: [facility] INVASIVE [facility] LAB;  Service: Cardiovascular;  Laterality: N/A;     Social History:   reports that he has never smoked. He has never used smokeless tobacco. He reports that he does not drink alcohol and does not use drugs.   Family History:  His family history includes Diabetes in his mother; Kidney Stones in his mother and sister; Kidney disease in his mother.   Allergies Allergies  Allergen Reactions   Lipitor [Atorvastatin  Calcium ] Other (See Comments)    Myalgia    Oxycodone-Acetaminophen Nausea And Vomiting     Home Medications  Prior to Admission medications   Medication Sig Start Date End Date Taking? Authorizing Provider  albuterol  (VENTOLIN HFA) 108 (90 Base) MCG/ACT inhaler Inhale 2 puffs into the lungs every 6 (six) hours as needed for wheezing or shortness of breath.   Yes [provider]  amLODipine (NORVASC) 5 MG tablet Take 5 mg by mouth every evening.   Yes [provider]  aspirin  EC 81 MG tablet Take 1 tablet (81 mg total) by mouth daily. Swallow whole. 02/14/24  Yes Madireddy, Alean SAUNDERS, MD  Cholecalciferol (VITAMIN D-3) 125 MCG (5000 UT) TABS Take 5,000 Units by mouth in the morning.   Yes [provider]  Garlic 300 MG CAPS Take 300 mg by mouth every evening.   Yes [provider]  hydrOXYzine (ATARAX) 25 MG tablet Take 25 mg by mouth every other day as needed (sleep).   Yes [provider]  losartan-hydrochlorothiazide (HYZAAR) 50-12.5 MG tablet Take 1 tablet by mouth in the morning. 04/01/20  Yes [provider]  minocycline (MINOCIN) 100 MG capsule Take 100 mg by mouth daily as needed (skin blemishes). 04/05/20  Yes [provider]  Multiple Vitamin (MULTIVITAMIN) tablet Take 1 tablet by mouth in the morning.   Yes [provider]  rosuvastatin  (CRESTOR ) 20 MG tablet Take 1 tablet (20 mg total) by mouth daily. Patient taking differently: Take 20 mg by mouth at bedtime. 05/20/24 05/20/25 Yes Ladona Heinz, MD  tamsulosin (FLOMAX) 0.4 MG CAPS capsule Take 0.4 mg by mouth every 3 (three) days. Patient takes this med at night   Yes [provider]  triamcinolone (KENALOG) 0.1 % Apply 1 application  topically 2 (two) times daily as needed (skin rash/irritation.).   Yes [provider]  ezetimibe (ZETIA) 10 MG tablet Take 10 mg by mouth daily in the afternoon. Patient not taking: Reported on 06/01/2024    [provider]  meclizine (ANTIVERT) 25 MG tablet Take 25 mg by mouth 3 (three) times daily as  needed for dizziness.    [provider]     Critical care time: 71    Tinnie FORBES Adolph DEVONNA [facility] 06/02/24 2:25 PM  Please see [email or web address] for pager details.  From 7A-7P if no response, please call [phone] After hours, please call ELink [phone]

## 2024-06-02 NOTE — Transfer of Care (Signed)
 Immediate Anesthesia Transfer of Care Note  Patient: Dustin Sloan  Procedure(s) Performed: CORONARY ARTERY BYPASS GRAFTING X 4, USING LEFT INTERNAL MAMMARY ARTERY AND ENDOSCOPIC HARVESTED RIGHT AND LEFT GREATER SAPHENOUS VEIN (Chest) ECHOCARDIOGRAM, TRANSESOPHAGEAL, INTRAOPERATIVE  Patient Location: ICU  Anesthesia Type:General  Level of Consciousness: Patient remains intubated per anesthesia plan  Airway & Oxygen Therapy: Patient remains intubated per anesthesia plan and Patient placed on Ventilator (see vital sign flow sheet for setting)  Post-op Assessment: Report given to RN and Post -op Vital signs reviewed and stable  Post vital signs: Reviewed and stable  Last Vitals:  Vitals Value Taken Time  BP 99/61 1550  Temp    Pulse 95 06/02/24 15:50  Resp 16 06/02/24 15:50  SpO2    Vitals shown include unfiled device data.  Last Pain:  Vitals:   06/02/24 0605  TempSrc: Oral  PainSc: 0-No pain      Patients Stated Pain Goal: 0 (06/02/24 9394)  Complications: No notable events documented.

## 2024-06-02 NOTE — Op Note (Signed)
 Date of Surgery: 06/02/24   Preop Diagnosis:  Multivessel coronary artery disease Postop Diagnosis: Same Surgeon:  Con GORMAN Clunes, MD Assistant(s):  Donielle Zimmerman PA-C (performed [facility]) Anesthesia: GET  Procedures: 1. CABG x 4 (SVG - PDA, SVG - OM, SVG - diag, LIMA - LAD) 2. Endoscopic Vein Harvest   Cross clamp time: 115 min Bypass time: 176 min  Approach:  Median sternotomy Drains: 1 left pleural 28 fr, 1 mediastinal 28 Fr right angle, 1 mediastinal 36 Fr straight Pacing Wires: atrial and ventricular Findings of Procedure: 1. Heavily calcified arteries  Estimated Blood Loss: 350 mL Blood Products Administered: 3U PRBC, 1 plt, 2 cryo Disposition:  ICU Specimens:  none Indication: Dustin Sloan is a 73 year old man with history of BPH, kidney stone, HTN and HL who presents with progressively worsening shortness of breath and decrease exercise tolerance. He also reports some chest discomfort that comes and goes. Work up has demonstrated multi-vessel CAD with preserved EF and no valvular disease. He is very functional and a good candidate for CABG.    Procedure in Detail:  The risks, benefits, complications, treatment options, and expected outcomes were discussed with the patient. The possibilities of reaction to medication, pulmonary aspiration, perforation of viscus, bleeding, recurrent infection, the need for additional procedures, failure to diagnose a condition, and creating a complication requiring transfusion or operation were discussed with the patient. The patient concurred with the proposed plan, giving informed consent. The patient was taken to the operating room, identified as Dustin Sloan and the procedure verified as Coronary Artery Bypass Grafting. A Time Out was held and the above information confirmed.  The patient was prepped and draped in a sterile fashion. An endoscopic vein harvesting in the right and left legs was carried out by Donielle Zimmerman PA-C. A  standard median sternotomy was performed. The left internal mammary artery was taken down using cautery and clips and left pleural chest tube was placed.  Intervenous heparin was given at the appropriate dose to obtain anticoagulation sufficient for CPB. The LIMA was then transected distally. The flow was excellent.   The pericardium was then opened and the pericardial well was created. The aorta was palpated and free of calcium /plaque.  Concentric purse strings were placed and the aorta was cannulated with a 20 fr EOPA cannula. After adequate de-airing this cannula was connected to the CPB circuit.  A purse string was placed around the right atrial appendage and it was cannulated with a large triple stage venous cannula. Prior to initiating CPB the LIMA was fashioned to the appropriate length.   Cardiopulmonary bypass was then initiated. The distal targets (PDA, OM, diagonal, LAD)  were then evaluated and marked. The heart basket was then placed into the appropriate position. An antegrade vent/cardioplegia cannula was then placed. The aortic cross clamp was then applied and the heart was arrested with antegrade cardioplegia. Cardioplegia was then administered intermittantly to ensure adequate myocardial protection.   The distal anastomosis to the PDA was completed first. After positioning the heart, the artery was opened and RSV was ananstomosed to the artery using 7-0 prolene in a running fashion. The flow was tested and was adequate.   Next, the distal anastomosis to  to the OM was completed. After positioning the heart, the artery was opened and RSV was ananstomosed to the artery using 7-0 prolene in a running fashion. The flow was tested and was adequate.    Next, the distal anastomosis to  to the diagonal  was completed. After positioning the heart, the artery was opened and RSV was ananstomosed to the artery using 7-0 prolene in a running fashion. The flow was tested and was adequate.    Next the  LIMA to LAD anastomosis was completed in a similar fashion with 7-0 prolene.    The proximal anastomoses were then completed in the standard fashion using 6-0 prolene in a running fashion. After adequate deairing maneuvers, the cross clamp was removed and the heart was reperfused.   Examination of all surgical sites revealed good hemostasis. Atrial and ventricular epicardial pacing wires were then placed. The patient was fully ventillated and successfully weaned off of CPB. After a test dose of protamine, the patient was fully decannulated.  The heparin was fully reversed with protamine. Again, examination of all surgical sites were evaluated for hemostasis which was good.  Post-bypass TEE revealed preserved RV/LV function and no new wall motion abnormalities.    Mediastinal drains were placed. The sternum was closed using stainless steel wires. The fascia was closed with #1 vicryl and the deep dermal with 2-0 Vicryl. The skin was closed with 3-0 Vicryl. A sterile dressing was applied over the incision.  At the end of the operation, all sponge, instruments, and needle counts were correct.   The patient tolerated the procedure without complication and was transported to the CTICU in stable condition.  Complications: none  Specimen: None  Implants: None

## 2024-06-02 NOTE — Anesthesia Procedure Notes (Signed)
 Arterial Line Insertion Start/End10/14/2025 6:45 AM, 06/02/2024 6:50 AM Performed by: Leonce Athens, MD, Ajani Rineer, Derrek Dacosta, CRNA, CRNA  Patient location: Pre-op. Preanesthetic checklist: patient identified, IV checked, site marked, risks and benefits discussed, surgical consent, monitors and equipment checked, pre-op evaluation, timeout performed and anesthesia consent Lidocaine 1% used for infiltration Left, radial was placed Catheter size: 20 G Hand hygiene performed  and maximum sterile barriers used  Allen's test indicative of satisfactory collateral circulation Attempts: 1 Following insertion, dressing applied and Biopatch. Post procedure assessment: normal and unchanged  Patient tolerated the procedure well with no immediate complications.

## 2024-06-02 NOTE — Interval H&P Note (Signed)
 History and Physical Interval Note:  06/02/2024 6:37 AM  Dustin Sloan  has presented today for surgery, with the diagnosis of CAD.  The various methods of treatment have been discussed with the patient and family. After consideration of risks, benefits and other options for treatment, the patient has consented to  Procedure(s): CORONARY ARTERY BYPASS GRAFTING (CABG) (N/A) ECHOCARDIOGRAM, TRANSESOPHAGEAL, INTRAOPERATIVE (N/A) as a surgical intervention.  The patient's history has been reviewed, patient examined, no change in status, stable for surgery.  I have reviewed the patient's chart and labs.  Questions were answered to the patient's satisfaction.     Con RAMAN Justinn Welter

## 2024-06-03 ENCOUNTER — Encounter (HOSPITAL_COMMUNITY): Payer: Self-pay

## 2024-06-03 ENCOUNTER — Inpatient Hospital Stay (HOSPITAL_COMMUNITY)

## 2024-06-03 DIAGNOSIS — I5032 Chronic diastolic (congestive) heart failure: Secondary | ICD-10-CM

## 2024-06-03 DIAGNOSIS — R918 Other nonspecific abnormal finding of lung field: Secondary | ICD-10-CM | POA: Diagnosis not present

## 2024-06-03 DIAGNOSIS — E119 Type 2 diabetes mellitus without complications: Secondary | ICD-10-CM

## 2024-06-03 DIAGNOSIS — N179 Acute kidney failure, unspecified: Secondary | ICD-10-CM

## 2024-06-03 DIAGNOSIS — Z981 Arthrodesis status: Secondary | ICD-10-CM | POA: Diagnosis not present

## 2024-06-03 DIAGNOSIS — J939 Pneumothorax, unspecified: Secondary | ICD-10-CM | POA: Diagnosis not present

## 2024-06-03 DIAGNOSIS — Z4682 Encounter for fitting and adjustment of non-vascular catheter: Secondary | ICD-10-CM | POA: Diagnosis not present

## 2024-06-03 DIAGNOSIS — I700 Atherosclerosis of aorta: Secondary | ICD-10-CM | POA: Diagnosis not present

## 2024-06-03 DIAGNOSIS — Z452 Encounter for adjustment and management of vascular access device: Secondary | ICD-10-CM | POA: Diagnosis not present

## 2024-06-03 LAB — GLUCOSE, CAPILLARY
Glucose-Capillary: 102 mg/dL — ABNORMAL HIGH (ref 70–99)
Glucose-Capillary: 109 mg/dL — ABNORMAL HIGH (ref 70–99)
Glucose-Capillary: 140 mg/dL — ABNORMAL HIGH (ref 70–99)
Glucose-Capillary: 141 mg/dL — ABNORMAL HIGH (ref 70–99)
Glucose-Capillary: 143 mg/dL — ABNORMAL HIGH (ref 70–99)
Glucose-Capillary: 148 mg/dL — ABNORMAL HIGH (ref 70–99)
Glucose-Capillary: 150 mg/dL — ABNORMAL HIGH (ref 70–99)
Glucose-Capillary: 151 mg/dL — ABNORMAL HIGH (ref 70–99)
Glucose-Capillary: 154 mg/dL — ABNORMAL HIGH (ref 70–99)
Glucose-Capillary: 167 mg/dL — ABNORMAL HIGH (ref 70–99)
Glucose-Capillary: 168 mg/dL — ABNORMAL HIGH (ref 70–99)
Glucose-Capillary: 170 mg/dL — ABNORMAL HIGH (ref 70–99)
Glucose-Capillary: 184 mg/dL — ABNORMAL HIGH (ref 70–99)

## 2024-06-03 LAB — CBC
HCT: 23.8 % — ABNORMAL LOW (ref 39.0–52.0)
HCT: 25.1 % — ABNORMAL LOW (ref 39.0–52.0)
Hemoglobin: 8.1 g/dL — ABNORMAL LOW (ref 13.0–17.0)
Hemoglobin: 8.4 g/dL — ABNORMAL LOW (ref 13.0–17.0)
MCH: 29.7 pg (ref 26.0–34.0)
MCH: 29.9 pg (ref 26.0–34.0)
MCHC: 34 g/dL (ref 30.0–36.0)
MCHC: 33.5 g/dL (ref 30.0–36.0)
MCV: 87.2 fL (ref 80.0–100.0)
MCV: 89.3 fL (ref 80.0–100.0)
Platelets: 89 K/uL — ABNORMAL LOW (ref 150–400)
Platelets: 82 K/uL — ABNORMAL LOW (ref 150–400)
RBC: 2.73 MIL/uL — ABNORMAL LOW (ref 4.22–5.81)
RBC: 2.81 MIL/uL — ABNORMAL LOW (ref 4.22–5.81)
RDW: 18.4 % — ABNORMAL HIGH (ref 11.5–15.5)
RDW: 19 % — ABNORMAL HIGH (ref 11.5–15.5)
WBC: 7.9 K/uL (ref 4.0–10.5)
WBC: 9 K/uL (ref 4.0–10.5)
nRBC: 0 % (ref 0.0–0.2)
nRBC: 0 % (ref 0.0–0.2)

## 2024-06-03 LAB — BPAM CRYOPRECIPITATE
Blood Product Expiration Date: 202510172359
Blood Product Expiration Date: 202510192359
ISSUE DATE / TIME: 202510141325
ISSUE DATE / TIME: 202510141336
Unit Type and Rh: 5100
Unit Type and Rh: 5100

## 2024-06-03 LAB — PREPARE PLATELET PHERESIS: Unit division: 0

## 2024-06-03 LAB — BASIC METABOLIC PANEL WITH GFR
Anion gap: 13 (ref 5–15)
Anion gap: 12 (ref 5–15)
BUN: 12 mg/dL (ref 8–23)
BUN: 17 mg/dL (ref 8–23)
CO2: 23 mmol/L (ref 22–32)
CO2: 25 mmol/L (ref 22–32)
Calcium: 7.3 mg/dL — ABNORMAL LOW (ref 8.9–10.3)
Calcium: 7.9 mg/dL — ABNORMAL LOW (ref 8.9–10.3)
Chloride: 103 mmol/L (ref 98–111)
Chloride: 101 mmol/L (ref 98–111)
Creatinine, Ser: 1.13 mg/dL (ref 0.61–1.24)
Creatinine, Ser: 1.37 mg/dL — ABNORMAL HIGH (ref 0.61–1.24)
GFR, Estimated: >60 mL/min (ref >60)
GFR, Estimated: 55 mL/min — ABNORMAL LOW (ref >60)
Glucose, Bld: 153 mg/dL — ABNORMAL HIGH (ref 70–99)
Glucose, Bld: 165 mg/dL — ABNORMAL HIGH (ref 70–99)
Potassium: 3.8 mmol/L (ref 3.5–5.1)
Potassium: 4.9 mmol/L (ref 3.5–5.1)
Sodium: 139 mmol/L (ref 135–145)
Sodium: 138 mmol/L (ref 135–145)

## 2024-06-03 LAB — PREPARE FRESH FROZEN PLASMA

## 2024-06-03 LAB — BPAM FFP
Blood Product Expiration Date: 202510152359
Blood Product Expiration Date: 202510172359
ISSUE DATE / TIME: 202510141640
ISSUE DATE / TIME: 202510141747
Unit Type and Rh: 600
Unit Type and Rh: 6200

## 2024-06-03 LAB — POCT I-STAT 7, (LYTES, BLD GAS, ICA,H+H)
Acid-base deficit: 8 mmol/L — ABNORMAL HIGH (ref 0.0–2.0)
Bicarbonate: 17 mmol/L — ABNORMAL LOW (ref 20.0–28.0)
Calcium, Ion: 0.97 mmol/L — ABNORMAL LOW (ref 1.15–1.40)
HCT: 17 % — ABNORMAL LOW (ref 39.0–52.0)
Hemoglobin: 5.8 g/dL — CL (ref 13.0–17.0)
O2 Saturation: 95 %
Patient temperature: 37.3
Potassium: 3.7 mmol/L (ref 3.5–5.1)
Sodium: 142 mmol/L (ref 135–145)
TCO2: 18 mmol/L — ABNORMAL LOW (ref 22–32)
pCO2 arterial: 33.6 mmHg (ref 32–48)
pH, Arterial: 7.313 — ABNORMAL LOW (ref 7.35–7.45)
pO2, Arterial: 84 mmHg (ref 83–108)

## 2024-06-03 LAB — PREPARE CRYOPRECIPITATE
Unit division: 0
Unit division: 0

## 2024-06-03 LAB — BPAM PLATELET PHERESIS
Blood Product Expiration Date: 202510172359
ISSUE DATE / TIME: 202510141323
Unit Type and Rh: 6200

## 2024-06-03 LAB — MAGNESIUM
Magnesium: 2.1 mg/dL (ref 1.7–2.4)
Magnesium: 2.2 mg/dL (ref 1.7–2.4)

## 2024-06-03 LAB — PREPARE RBC (CROSSMATCH)

## 2024-06-03 MED ORDER — POTASSIUM CHLORIDE 10 MEQ/50ML IV SOLN
10.0000 meq | INTRAVENOUS | Status: AC
Start: 2024-06-03 — End: 2024-06-03
  Administered 2024-06-03 (×3): 10 meq via INTRAVENOUS
  Filled 2024-06-03 (×3): qty 50

## 2024-06-03 MED ORDER — SODIUM CHLORIDE 0.9% IV SOLUTION: Freq: Once | INTRAVENOUS | Status: DC

## 2024-06-03 MED ORDER — SODIUM BICARBONATE 8.4 % IV SOLN
50.0000 meq | Freq: Once | INTRAVENOUS | Status: AC
  Administered 2024-06-03: 50 meq via INTRAVENOUS

## 2024-06-03 MED ORDER — ORAL CARE MOUTH RINSE: 15.0000 mL | OROMUCOSAL | Status: DC | PRN

## 2024-06-03 MED ORDER — LIDOCAINE 5 % EX PTCH
1.0000 | MEDICATED_PATCH | CUTANEOUS | Status: DC
  Administered 2024-06-03 – 2024-06-08 (×6): 1 via TRANSDERMAL
  Filled 2024-06-03 (×6): qty 1

## 2024-06-03 MED ORDER — SODIUM CHLORIDE 0.9 % IV SOLN: 2.0000 g | Freq: Once | INTRAVENOUS | Status: DC

## 2024-06-03 MED ORDER — FUROSEMIDE 10 MG/ML IJ SOLN
40.0000 mg | Freq: Once | INTRAMUSCULAR | Status: AC
  Administered 2024-06-03: 40 mg via INTRAVENOUS
  Filled 2024-06-03: qty 4

## 2024-06-03 MED ORDER — LACTATED RINGERS IV BOLUS
500.0000 mL | Freq: Once | INTRAVENOUS | Status: AC
  Administered 2024-06-03: 500 mL via INTRAVENOUS

## 2024-06-03 MED ORDER — INSULIN ASPART 100 UNIT/ML IJ SOLN
0.0000 [IU] | INTRAMUSCULAR | Status: DC
  Administered 2024-06-03 (×4): 2 [IU] via SUBCUTANEOUS

## 2024-06-03 MED ORDER — CALCIUM GLUCONATE-NACL 2-0.675 GM/100ML-% IV SOLN
2.0000 g | Freq: Once | INTRAVENOUS | Status: AC
  Administered 2024-06-03: 2000 mg via INTRAVENOUS
  Filled 2024-06-03: qty 100

## 2024-06-03 MED FILL — Thrombin (Recombinant) For Soln 20000 Unit: CUTANEOUS | Qty: 1 | Status: AC

## 2024-06-03 NOTE — Progress Notes (Signed)
 1 Day Post-Op Procedure(s) (LRB): CORONARY ARTERY BYPASS GRAFTING X 4, USING LEFT INTERNAL MAMMARY ARTERY AND ENDOSCOPIC HARVESTED RIGHT AND LEFT GREATER SAPHENOUS VEIN (N/A) ECHOCARDIOGRAM, TRANSESOPHAGEAL, INTRAOPERATIVE (N/A) Subjective: Extubated and doing ok this morning, having expected post-op pain Got 1U PRBC, bicarb and IVF overnight for metabolic acidosis Hemodynamics good this morning - off epi, just a little levo Stood this morning without orthostasis Weight is 20 lbs up from pre-op  Objective: Vital signs in last 24 hours: BP 100/68   Pulse 92   Temp 97.8 F (36.6 C) (Axillary)   Resp (!) 23   Ht 5' 5 (1.651 m)   Wt 77.4 kg   SpO2 93%   BMI 28.40 kg/m  Filed Weights   06/02/24 0615 06/03/24 0600  Weight: 68 kg 77.4 kg    Hemodynamic parameters for last 24 hours: CVP:  [3 mmHg-42 mmHg] 13 mmHg CO:  [4.7 L/min-6.5 L/min] 4.9 L/min CI:  [2.7 L/min/m2-3.7 L/min/m2] 2.8 L/min/m2  Intake/Output from previous day: 10/14 0701 - 10/15 0700 In: 89876 [P.O.:90; I.V.:4497; Blood:1995; NG/GT:110; IV Piggyback:3431] Out: 5060 [Urine:4360; Emesis/NG output:50; Chest Tube:650] Intake/Output this shift: Total I/O In: 50.7 [I.V.:50.7] Out: 150 [Urine:120; Chest Tube:30]  Physical Exam: General - Resting in bed, appears uncomfortable but not in distress CV - RRR Resp - Unlabored on nasal cannula Abd - Soft, ND/NT Ext - Moderate lower extremity and upper extremity edema  Lab Results:    Latest Ref Rng & Units 06/03/2024    6:18 AM 06/03/2024   12:06 AM 06/02/2024   10:42 PM  CBC  WBC 4.0 - 10.5 K/uL 7.9     Hemoglobin 13.0 - 17.0 g/dL 8.1  5.8  5.1   Hematocrit 39.0 - 52.0 % 23.8  17.0  15.0   Platelets 150 - 400 K/uL 89         Latest Ref Rng & Units 06/03/2024    6:18 AM 06/03/2024   12:06 AM 06/02/2024   10:42 PM  CMP  Glucose 70 - 99 mg/dL 846     BUN 8 - 23 mg/dL 12     Creatinine 9.38 - 1.24 mg/dL 8.86     Sodium 864 - 854 mmol/L 139  142  142    Potassium 3.5 - 5.1 mmol/L 3.8  3.7  3.6   Chloride 98 - 111 mmol/L 103     CO2 22 - 32 mmol/L 23     Calcium  8.9 - 10.3 mg/dL 7.3       CXR: Pulmonary edema and LLL atelectasis  Assessment/Plan: S/P Procedure(s) (LRB): CORONARY ARTERY BYPASS GRAFTING X 4, USING LEFT INTERNAL MAMMARY ARTERY AND ENDOSCOPIC HARVESTED RIGHT AND LEFT GREATER SAPHENOUS VEIN (N/A) ECHOCARDIOGRAM, TRANSESOPHAGEAL, INTRAOPERATIVE (N/A) POD1 s/p CABG x 4, progressing well NEURO- intact  Pain control PRN Deconditioning- PT/OT ok if needed CV- in SR around 80 bpm with 1st degree block             Cap pacing wires RESP- Pulmonary edema on CXR, stable on [place]             Continue IS, pulm hygiene, ambulation  Keep Chest tubes RENAL- creatinine and lytes Ok  Weight up 20 lbs             Start Lasix this afternoon once off levo  Foley will stay for today GI- tolerating liquids             Advance diet  BM: None since surgery Endo- BG well controlled Transition  to q4 ISS per endotool ID- [id number] DVT ppx - SCDs  Dispo: ICU   LOS: 1 day    Con RAMAN Ustin Cruickshank 06/03/2024

## 2024-06-03 NOTE — TOC CM/SW Note (Signed)
 Transition of Care [facility]) - Inpatient Brief Assessment   Patient Details  Name: Dustin Sloan MRN: [id number] Date of Birth: [birth year]  Transition of Care [facility]) CM/SW Contact:    Lauraine FORBES Saa, LCSWA Phone Number: 06/03/2024, 2:55 PM   Clinical Narrative:  2:55 PM Per chart review, patient resides at home with spouse. Patient has a PCP and insurance. Patient does not have SNF/HH/DME history. Patient's preferred pharmacy's are [facility], [facility] [place], and [facility] [place]. No TOC needs identified at this time. TOC will continue to follow.   Transition of Care Asessment: Insurance and Status: Insurance coverage has been reviewed Patient has primary care physician: Yes Home environment has been reviewed: Private Residence Prior level of function:: N/A Prior/Current Home Services: No current home services Social Drivers of Health Review: SDOH reviewed no interventions necessary Readmission risk has been reviewed: Yes (Currently Green 14%) Transition of care needs: no transition of care needs at this time

## 2024-06-03 NOTE — Progress Notes (Signed)
 NAME:  Dustin Sloan, MRN:  [id number], DOB:  1951-06-29, LOS: 1 ADMISSION DATE:  06/02/2024, CONSULTATION DATE:  10/14 REFERRING MD:  Dr. Daniel, TCTS CHIEF COMPLAINT:  s/p CABG  History of Present Illness:  73 year old male with past medical history of arthritis, BPH, sleep apnea not on CPAP, pre-diabetes, hypertension, hyperlipidemia with statin intolerance, CAD who presents to [facility] for CABG. Initially evaluated by cardiology for shortness of breath. He had TTE from PCP on 01/2024 with LVEF 50-55%, G1DD, trace AI, mild MR/TR. From there, was given nuclear stress test with abnormal EKG and referred for cardiac CT. 04/28/2024 had coronary CT with severe CAD, coronary calcium  score 3227. Had LHC 10/1 with severe and complex proximal LAD stenosis, high-grade OM2 stenosis and PDA. He was referred to TCTS. He had repeat echo 10/7 with LVEF 60-65%, G1DD.   Pump time: 2h 28m Xclamp time: 1h 6m EBL: 350cc (verbal report of moderate bleeding from leg on bypass, not calculated) Cell saver: 550cc UOP: 2400cc 750cc albumin, 3 PRBC, 2 cryo, 1 plt, 2900ccLR  Pertinent  Medical History  arthritis, BPH, sleep apnea not on CPAP, pre-diabetes, hypertension, hyperlipidemia with statin intolerance, CAD  Significant Hospital Events: Including procedures, antibiotic start and stop dates in addition to other pertinent events   10/14: admit post op to CCU for CABG x 4  Interim History / Subjective:  Patient received 1 unit PRBC overnight, he was successfully extubated per rapid weaning protocol Continue to complain of surgical site pain Denies nausea and vomiting  Objective   Blood pressure 106/69, pulse 75, temperature 97.8 F (36.6 C), temperature source Axillary, resp. rate 14, height 5' 5 (1.651 m), weight 77.4 kg, SpO2 95%. CVP:  [3 mmHg-42 mmHg] 13 mmHg CO:  [3.8 L/min-6.5 L/min] 3.8 L/min CI:  [2.2 L/min/m2-3.7 L/min/m2] 2.2 L/min/m2  Vent Mode: PSV;CPAP FiO2 (%):  [40 %-50 %] 40 % Set Rate:  [4 bmp-16  bmp] 4 bmp Vt Set:  [500 mL] 500 mL PEEP:  [5 cmH20] 5 cmH20 Pressure Support:  [10 cmH20] 10 cmH20 Plateau Pressure:  [17 cmH20-19 cmH20] 17 cmH20   Intake/Output Summary (Last 24 hours) at 06/03/2024 0906 Last data filed at 06/03/2024 0900 Gross per 24 hour  Intake 9970.83 ml  Output 5185 ml  Net 4785.83 ml   Filed Weights   06/02/24 0615 06/03/24 0600  Weight: 68 kg 77.4 kg    Examination: General: Elderly male, lying on the bed HEENT: [place]/AT, eyes anicteric.  moist mucus membranes Neuro: Alert, awake following commands Chest: Central sternotomy incision looks clean and dry, coarse breath sounds, no wheezes or rhonchi.  Pacer wires, mediastinal and chest tube in place Heart: Regular rate and rhythm, no murmurs or gallops Abdomen: Soft, nontender, nondistended, bowel sounds present  Labs and images reviewed  Patient Lines/Drains/Airways Status     Active Line/Drains/Airways     Name Placement date Placement time Site Days   Arterial Line 06/02/24 Left Radial 06/02/24  0645  Radial  1   Peripheral IV 06/02/24 18 G Left Antecubital 06/02/24  0634  Antecubital  1   CVC Triple Lumen 06/02/24 Right Internal jugular 06/02/24  --  -- 1   Urethral Catheter T. Wagoner RN Temperature probe;Latex 16 Fr. 06/02/24  0755  Temperature probe;Latex  1   Y Chest Tube 1, 2, and 3 1 Medial Mediastinal 28 Fr. 2 Medial Mediastinal 36 Fr. 3 Left Pleural 28 Fr. 06/02/24  1356  -- 1   Wound 06/02/24 0941 Surgical Closed Surgical  Incision Chest Other (Comment) 06/02/24  0941  Chest  1   Wound 06/02/24 0941 Surgical Closed Surgical Incision Leg Left 06/02/24  0941  Leg  1   Wound 06/02/24 0941 Surgical Closed Surgical Incision Leg Right 06/02/24  0941  Leg  1         Resolved Hospital Problem list    Assessment & Plan:  Multivessel coronary artery disease s/p CABG x 4 Post cardiotomy vasoplegia Continue aspirin  and Zetia Patient is intolerant to statins Chest tube management TCTS Continue  multimodality pain management with tramadol, oxycodone and morphine Chest tube output was 650 since coming back from the OR yesterday Closely monitor chest tube output Off epinephrine, on 1 mic of Levophed, continue to titrate with MAP goal 65 Will diurese today as weight is up 20 pounds  Acute respiratory insufficiency, postop Patient was successfully extubated per rapid weaning protocol Encourage incentive spirometry and ambulation  Chronic HFpEF Monitor intake and output EF 60 to 65% with grade 1 diastolic dysfunction GDMT once able to tolerate  Hypertension Holding antihypertensive for now, as patient is requiring low-dose norepinephrine  Hyperlipidemia Continue Zetia  Diabetes type 2 Patient hemoglobin A1c is 5.5 Transition off of insulin infusion  Expected perioperative blood loss anemia Thrombocytopenia due to CPB Coagulopathy Received 2 units PRBCs, platelets, cryo and FFP's  monitor H/H and PLT counts  BPH Continue home tamsulosin 0.4mg  TID   Hypocalcemia Continue aggressive replacement  Labs   CBC: Recent Labs  Lab 06/01/24 0847 06/02/24 0803 06/02/24 1232 06/02/24 1356 06/02/24 1557 06/02/24 2141 06/02/24 2242 06/03/24 0006 06/03/24 0618  WBC 5.3  --   --   --  7.4 8.6  --   --  7.9  HGB 15.4   < > 6.7*   < > 7.8*  7.4* 7.2* 5.1* 5.8* 8.1*  HCT 46.6   < > 19.5*   < > 23.0*  21.9* 21.1* 15.0* 17.0* 23.8*  MCV 96.3  --   --   --  94.4 90.6  --   --  87.2  PLT 165  --  93*  --  66* 90*  --   --  89*   < > = values in this interval not displayed.    Basic Metabolic Panel: Recent Labs  Lab 06/01/24 0847 06/02/24 0803 06/02/24 1221 06/02/24 1356 06/02/24 1401 06/02/24 1506 06/02/24 1557 06/02/24 2141 06/02/24 2242 06/03/24 0006 06/03/24 0618  NA 140   < > 137   < > 141 144 145 138 142 142 139  K 3.9   < > 4.9   < > 3.7 3.8 3.7 3.8 3.6 3.7 3.8  CL 104   < > 96*  --  100 102  --  105  --   --  103  CO2 27  --   --   --   --   --   --   20*  --   --  23  GLUCOSE 109*   < > 142*  --  150* 123*  --  163*  --   --  153*  BUN 10   < > 10  --  8 7*  --  9  --   --  12  CREATININE 1.04   < > 0.70  --  0.70 0.70  --  1.08  --   --  1.13  CALCIUM  9.5  --   --   --   --   --   --  6.7*  --   --  7.3*  MG  --   --   --   --   --   --   --  2.7*  --   --  2.1   < > = values in this interval not displayed.   GFR: Estimated Creatinine Clearance: 56.8 mL/min (by C-G formula based on SCr of 1.13 mg/dL). Recent Labs  Lab 06/01/24 0847 06/02/24 1557 06/02/24 2141 06/03/24 0618  WBC 5.3 7.4 8.6 7.9    Liver Function Tests: Recent Labs  Lab 06/01/24 0847  AST 26  ALT 26  ALKPHOS 55  BILITOT 0.7  PROT 7.5  ALBUMIN 4.3   No results for input(s): LIPASE, AMYLASE in the last 168 hours. No results for input(s): AMMONIA in the last 168 hours.  ABG    Component Value Date/Time   PHART 7.313 (L) 06/03/2024 0006   PCO2ART 33.6 06/03/2024 0006   PO2ART 84 06/03/2024 0006   HCO3 17.0 (L) 06/03/2024 0006   TCO2 18 (L) 06/03/2024 0006   ACIDBASEDEF 8.0 (H) 06/03/2024 0006   O2SAT 95 06/03/2024 0006     Coagulation Profile: Recent Labs  Lab 06/01/24 0847 06/02/24 1557  INR 1.0 2.5*    Cardiac Enzymes: No results for input(s): CKTOTAL, CKMB, CKMBINDEX, TROPONINI in the last 168 hours.  HbA1C: Hgb A1c MFr Bld  Date/Time Value Ref Range Status  06/01/2024 08:47 AM 5.5 4.8 - 5.6 % Final    Comment:    (NOTE) Diagnosis of Diabetes The following HbA1c ranges recommended by the American Diabetes Association (ADA) may be used as an aid in the diagnosis of diabetes mellitus.  Hemoglobin             Suggested A1C NGSP%              Diagnosis  <5.7                   Non Diabetic  5.7-6.4                Pre-Diabetic  >6.4                   Diabetic  <7.0                   Glycemic control for                       adults with diabetes.      CBG: Recent Labs  Lab 06/03/24 0351 06/03/24 0459  06/03/24 0611 06/03/24 0739 06/03/24 0856  GLUCAP 151* 140* 154* 109* 102*    Review of Systems:   As above  Past Medical History:  He,  has a past medical history of Abnormal stress test (04/15/2024), Abnormal urine (07/22/2023), Allergies, Arthritis, Asymptomatic varicose veins of both lower extremities (08/23/2020), Benign prostatic hyperplasia, Blood in urine (08/08/2021), Body mass index (BMI) 24.0-24.9, adult (12/27/2021), Body mass index (BMI) 27.0-27.9, adult (02/24/2019), CAD (coronary artery disease) (05/07/2024), Capsulitis of metatarsophalangeal (MTP) joint of right foot (04/14/2020), Chest pain (08/23/2020), Disequilibrium (09/26/2021), Dizziness, Electrocardiogram abnormal (03/29/2022), Encounter for examination of eyes and vision without abnormal findings (03/29/2022), Encounter for screening for cardiovascular disorders (07/22/2023), Essential hypertension, Fatigue, History of kidney stones, Hyperlipidemia, Hypertension, Impaired cognition, Inflammatory dermatosis, Kidney stone, Low testosterone, Other specified health status (04/11/2023), Pain in left foot (12/17/2019), Pain in right foot (12/17/2019), Pre-diabetes, Shortness of breath, Sleep apnea, Spasm (07/09/2022), Testicular hypofunction, Tight heel cords, acquired,  bilateral (04/15/2020), Unstable angina pectoris (HCC) (08/23/2020), and Vitamin D deficiency.   Surgical History:   Past Surgical History:  Procedure Laterality Date   APPENDECTOMY     2018   CATARACT EXTRACTION W/ INTRAOCULAR LENS IMPLANT Right 06/18/2023   COLONOSCOPY     EYE SURGERY     bilateral cataracts removed   HERNIA REPAIR     LITHOTRIPSY     x several   NECK SURGERY     1988, 2008   RIGHT/LEFT HEART CATH AND CORONARY ANGIOGRAPHY N/A 05/20/2024   Procedure: RIGHT/LEFT HEART CATH AND CORONARY ANGIOGRAPHY;  Surgeon: Ladona Heinz, MD;  Location: [facility];  Service: Cardiovascular;  Laterality: N/A;     Social History:   reports that  he has never smoked. He has never used smokeless tobacco. He reports that he does not drink alcohol and does not use drugs.   Family History:  His family history includes Diabetes in his mother; Kidney Stones in his mother and sister; Kidney disease in his mother.   Allergies Allergies  Allergen Reactions   Lipitor [Atorvastatin  Calcium ] Other (See Comments)    Myalgia    Oxycodone-Acetaminophen Nausea And Vomiting     Home Medications  Prior to Admission medications   Medication Sig Start Date End Date Taking? Authorizing Provider  albuterol (VENTOLIN HFA) 108 (90 Base) MCG/ACT inhaler Inhale 2 puffs into the lungs every 6 (six) hours as needed for wheezing or shortness of breath.   Yes [provider]  amLODipine (NORVASC) 5 MG tablet Take 5 mg by mouth every evening.   Yes [provider]  aspirin  EC 81 MG tablet Take 1 tablet (81 mg total) by mouth daily. Swallow whole. 02/14/24  Yes Madireddy, Alean SAUNDERS, MD  Cholecalciferol (VITAMIN D-3) 125 MCG (5000 UT) TABS Take 5,000 Units by mouth in the morning.   Yes [provider]  Garlic 300 MG CAPS Take 300 mg by mouth every evening.   Yes [provider]  hydrOXYzine (ATARAX) 25 MG tablet Take 25 mg by mouth every other day as needed (sleep).   Yes [provider]  losartan-hydrochlorothiazide (HYZAAR) 50-12.5 MG tablet Take 1 tablet by mouth in the morning. 04/01/20  Yes [provider]  minocycline (MINOCIN) 100 MG capsule Take 100 mg by mouth daily as needed (skin blemishes). 04/05/20  Yes [provider]  Multiple Vitamin (MULTIVITAMIN) tablet Take 1 tablet by mouth in the morning.   Yes [provider]  rosuvastatin  (CRESTOR ) 20 MG tablet Take 1 tablet (20 mg total) by mouth daily. Patient taking differently: Take 20 mg by mouth at bedtime. 05/20/24 05/20/25 Yes Ladona Heinz, MD  tamsulosin (FLOMAX) 0.4 MG CAPS capsule Take 0.4 mg by mouth every 3 (three) days. Patient  takes this med at night   Yes [provider]  triamcinolone (KENALOG) 0.1 % Apply 1 application  topically 2 (two) times daily as needed (skin rash/irritation.).   Yes [provider]  ezetimibe (ZETIA) 10 MG tablet Take 10 mg by mouth daily in the afternoon. Patient not taking: Reported on 06/01/2024    [provider]  meclizine (ANTIVERT) 25 MG tablet Take 25 mg by mouth 3 (three) times daily as needed for dizziness.    [provider]      Valinda Novas, MD [facility] See Amion for pager If no response to pager, please call [phone] until 7pm After 7pm, Please call E-link [phone]

## 2024-06-03 NOTE — Anesthesia Postprocedure Evaluation (Signed)
 Anesthesia Post Note  Patient: Dustin Sloan  Procedure(s) Performed: CORONARY ARTERY BYPASS GRAFTING X 4, USING LEFT INTERNAL MAMMARY ARTERY AND ENDOSCOPIC HARVESTED RIGHT AND LEFT GREATER SAPHENOUS VEIN (Chest) ECHOCARDIOGRAM, TRANSESOPHAGEAL, INTRAOPERATIVE     Patient location during evaluation: SICU Anesthesia Type: General Level of consciousness: awake and alert, oriented and patient cooperative Pain management: pain level controlled Vital Signs Assessment: post-procedure vital signs reviewed and stable Respiratory status: spontaneous breathing, nonlabored ventilation, respiratory function stable and patient connected to nasal cannula oxygen Cardiovascular status: blood pressure returned to baseline and stable (off of pressors and inotropes) Postop Assessment: no apparent nausea or vomiting Anesthetic complications: no   No notable events documented.  Last Vitals:  Vitals:   06/03/24 1600 06/03/24 1700  BP: 101/65 110/66  Pulse: 81 71  Resp: 11 14  Temp:    SpO2: 92% (!) 89%    Last Pain:  Vitals:   06/03/24 1630  TempSrc:   PainSc: 6                  Alawna Graybeal,E. Avyukt Cimo

## 2024-06-03 NOTE — Plan of Care (Signed)
  Problem: Clinical Measurements: Goal: Respiratory complications will improve Outcome: Progressing   Problem: Clinical Measurements: Goal: Cardiovascular complication will be avoided Outcome: Progressing   Problem: Clinical Measurements: Goal: Diagnostic test results will improve Outcome: Progressing   Problem: Activity: Goal: Risk for activity intolerance will decrease Outcome: Progressing

## 2024-06-04 ENCOUNTER — Ambulatory Visit

## 2024-06-04 ENCOUNTER — Inpatient Hospital Stay (HOSPITAL_COMMUNITY)

## 2024-06-04 DIAGNOSIS — N179 Acute kidney failure, unspecified: Secondary | ICD-10-CM

## 2024-06-04 LAB — BPAM RBC
Blood Product Expiration Date: 202511052359
Blood Product Expiration Date: 202511102359
Blood Product Expiration Date: 202511102359
Blood Product Expiration Date: 202511122359
ISSUE DATE / TIME: 202510150159
ISSUE DATE / TIME: 202510140829
ISSUE DATE / TIME: 202510140829
ISSUE DATE / TIME: 202510141423
Unit Type and Rh: 6200
Unit Type and Rh: 6200
Unit Type and Rh: 6200
Unit Type and Rh: 6200

## 2024-06-04 LAB — TYPE AND SCREEN
ABO/RH(D): A POS
Antibody Screen: NEGATIVE
Unit division: 0
Unit division: 0
Unit division: 0
Unit division: 0

## 2024-06-04 LAB — GLUCOSE, CAPILLARY
Glucose-Capillary: 114 mg/dL — ABNORMAL HIGH (ref 70–99)
Glucose-Capillary: 142 mg/dL — ABNORMAL HIGH (ref 70–99)
Glucose-Capillary: 131 mg/dL — ABNORMAL HIGH (ref 70–99)
Glucose-Capillary: 141 mg/dL — ABNORMAL HIGH (ref 70–99)
Glucose-Capillary: 128 mg/dL — ABNORMAL HIGH (ref 70–99)
Glucose-Capillary: 118 mg/dL — ABNORMAL HIGH (ref 70–99)

## 2024-06-04 LAB — BASIC METABOLIC PANEL WITH GFR
Anion gap: 9 (ref 5–15)
Anion gap: 11 (ref 5–15)
BUN: 26 mg/dL — ABNORMAL HIGH (ref 8–23)
BUN: 32 mg/dL — ABNORMAL HIGH (ref 8–23)
CO2: 27 mmol/L (ref 22–32)
CO2: 28 mmol/L (ref 22–32)
Calcium: 7.7 mg/dL — ABNORMAL LOW (ref 8.9–10.3)
Calcium: 7.9 mg/dL — ABNORMAL LOW (ref 8.9–10.3)
Chloride: 100 mmol/L (ref 98–111)
Chloride: 99 mmol/L (ref 98–111)
Creatinine, Ser: 1.58 mg/dL — ABNORMAL HIGH (ref 0.61–1.24)
Creatinine, Ser: 1.62 mg/dL — ABNORMAL HIGH (ref 0.61–1.24)
GFR, Estimated: 46 mL/min — ABNORMAL LOW
GFR, Estimated: 45 mL/min — ABNORMAL LOW (ref >60)
Glucose, Bld: 130 mg/dL — ABNORMAL HIGH (ref 70–99)
Glucose, Bld: 122 mg/dL — ABNORMAL HIGH (ref 70–99)
Potassium: 4.9 mmol/L (ref 3.5–5.1)
Potassium: 4.2 mmol/L (ref 3.5–5.1)
Sodium: 136 mmol/L (ref 135–145)
Sodium: 138 mmol/L (ref 135–145)

## 2024-06-04 LAB — LIPID PANEL
Cholesterol: 54 mg/dL (ref 0–200)
HDL: 20 mg/dL — ABNORMAL LOW (ref >40)
LDL Cholesterol: 16 mg/dL (ref 0–99)
Total CHOL/HDL Ratio: 2.7 ratio
Triglycerides: 92 mg/dL (ref <150)
VLDL: 18 mg/dL (ref 0–40)

## 2024-06-04 LAB — CBC
HCT: 23.5 % — ABNORMAL LOW (ref 39.0–52.0)
Hemoglobin: 7.6 g/dL — ABNORMAL LOW (ref 13.0–17.0)
MCH: 29.5 pg (ref 26.0–34.0)
MCHC: 32.3 g/dL (ref 30.0–36.0)
MCV: 91.1 fL (ref 80.0–100.0)
Platelets: 74 K/uL — ABNORMAL LOW (ref 150–400)
RBC: 2.58 MIL/uL — ABNORMAL LOW (ref 4.22–5.81)
RDW: 18.7 % — ABNORMAL HIGH (ref 11.5–15.5)
WBC: 8.2 K/uL (ref 4.0–10.5)
nRBC: 0 % (ref 0.0–0.2)

## 2024-06-04 LAB — COOXEMETRY PANEL
Carboxyhemoglobin: 1.4 % (ref 0.5–1.5)
Methemoglobin: 0.8 % (ref 0.0–1.5)
O2 Saturation: 57.8 %
Total hemoglobin: 8.2 g/dL — ABNORMAL LOW (ref 12.0–16.0)

## 2024-06-04 MED ORDER — FUROSEMIDE 10 MG/ML IJ SOLN
80.0000 mg | Freq: Once | INTRAMUSCULAR | Status: AC
  Administered 2024-06-04: 80 mg via INTRAVENOUS
  Filled 2024-06-04: qty 8

## 2024-06-04 MED ORDER — ASPIRIN 81 MG PO TBEC
81.0000 mg | DELAYED_RELEASE_TABLET | Freq: Every day | ORAL | Status: DC
  Administered 2024-06-04 – 2024-06-08 (×5): 81 mg via ORAL
  Filled 2024-06-04 (×5): qty 1

## 2024-06-04 MED ORDER — INSULIN ASPART 100 UNIT/ML IJ SOLN
0.0000 [IU] | Freq: Three times a day (TID) | INTRAMUSCULAR | Status: DC
  Administered 2024-06-04 – 2024-06-05 (×5): 2 [IU] via SUBCUTANEOUS

## 2024-06-04 MED ORDER — GABAPENTIN 100 MG PO CAPS
100.0000 mg | ORAL_CAPSULE | Freq: Two times a day (BID) | ORAL | Status: DC
  Administered 2024-06-04 – 2024-06-08 (×9): 100 mg via ORAL
  Filled 2024-06-04 (×9): qty 1

## 2024-06-04 MED ORDER — PANTOPRAZOLE SODIUM 40 MG IV SOLR
40.0000 mg | INTRAVENOUS | Status: DC
  Administered 2024-06-04 – 2024-06-06 (×3): 40 mg via INTRAVENOUS
  Filled 2024-06-04 (×3): qty 10

## 2024-06-04 MED ORDER — POLYETHYLENE GLYCOL 3350 17 G PO PACK
17.0000 g | PACK | Freq: Two times a day (BID) | ORAL | Status: DC
  Administered 2024-06-04 – 2024-06-08 (×5): 17 g via ORAL
  Filled 2024-06-04 (×7): qty 1

## 2024-06-04 MED ORDER — SENNOSIDES-DOCUSATE SODIUM 8.6-50 MG PO TABS
2.0000 | ORAL_TABLET | Freq: Every day | ORAL | Status: DC
  Administered 2024-06-04 – 2024-06-05 (×2): 2 via ORAL
  Filled 2024-06-04 (×2): qty 2

## 2024-06-04 MED ORDER — INSULIN ASPART 100 UNIT/ML IJ SOLN: 0.0000 [IU] | Freq: Every day | INTRAMUSCULAR | Status: DC

## 2024-06-04 MED ORDER — FUROSEMIDE 10 MG/ML IJ SOLN
10.0000 mg/h | INTRAVENOUS | Status: DC
  Filled 2024-06-04: qty 20

## 2024-06-04 MED ORDER — SORBITOL 70 % SOLN
30.0000 mL | Freq: Once | Status: AC
  Administered 2024-06-04: 30 mL via ORAL
  Filled 2024-06-04: qty 30

## 2024-06-04 MED FILL — Sodium Chloride IV Soln 0.9%: INTRAVENOUS | Qty: 2000 | Status: AC

## 2024-06-04 MED FILL — Potassium Chloride Inj 2 mEq/ML: INTRAVENOUS | Qty: 40 | Status: AC

## 2024-06-04 MED FILL — Sodium Bicarbonate IV Soln 8.4%: INTRAVENOUS | Qty: 50 | Status: AC

## 2024-06-04 MED FILL — Lidocaine HCl Local Preservative Free (PF) Inj 2%: INTRAMUSCULAR | Qty: 14 | Status: AC

## 2024-06-04 MED FILL — Electrolyte-R (PH 7.4) Solution: INTRAVENOUS | Qty: 8000 | Status: AC

## 2024-06-04 MED FILL — Heparin Sodium (Porcine) Inj 1000 Unit/ML: Qty: 1000 | Status: AC

## 2024-06-04 MED FILL — Heparin Sodium (Porcine) Inj 1000 Unit/ML: INTRAMUSCULAR | Qty: 30 | Status: AC

## 2024-06-04 MED FILL — Mannitol IV Soln 20%: INTRAVENOUS | Qty: 500 | Status: AC

## 2024-06-04 MED FILL — Heparin Sodium (Porcine) Inj 1000 Unit/ML: INTRAMUSCULAR | Qty: 20 | Status: AC

## 2024-06-04 NOTE — Progress Notes (Signed)
 Currently about -1.3 liters today Received 2 doses of lasix 80mg  [place] slightly increased Plan Will plan to repeat tomorrow as long as BUN/cr ~ same

## 2024-06-04 NOTE — Progress Notes (Signed)
 X2 mediastinal CT removed. L pleural left in place.

## 2024-06-04 NOTE — Evaluation (Signed)
 Clinical/Bedside Swallow Evaluation Patient Details  Name: Dustin Sloan MRN: [id number] Date of Birth: Sep 23, 1950  Today's Date: 06/04/2024 Time: SLP Start Time (ACUTE ONLY): 1131 SLP Stop Time (ACUTE ONLY): 1150 SLP Time Calculation (min) (ACUTE ONLY): 19 min  Past Medical History:  Past Medical History:  Diagnosis Date   Abnormal stress test 04/15/2024   Abnormal urine 07/22/2023   Unspecified abnormal findings in urine     Allergies    Arthritis    Asymptomatic varicose veins of both lower extremities 08/23/2020   Benign prostatic hyperplasia    Blood in urine 08/08/2021   Hematuria, unspecifiedHematuria, unspecified; Start Date : 10/24/2022Hematuria, unspecified; Start Date : 03/14/2021     Body mass index (BMI) 24.0-24.9, adult 12/27/2021   Body mass index [BMI] 24.0-24.9, adultBody mass index [BMI] 24.0-24.9, adult; Start Date : 02/07/2023Body mass index [BMI] 24.0-24.9, adult; Start Date : 12/20/2022Body mass index [BMI] 24.0-24.9, adult; Start Date : 06/12/2021     Body mass index (BMI) 27.0-27.9, adult 02/24/2019   Body mass index [BMI] 27.0-27.9, adult     CAD (coronary artery disease) 05/07/2024   Capsulitis of metatarsophalangeal (MTP) joint of right foot 04/14/2020   Chest pain 08/23/2020   Disequilibrium 09/26/2021   Dizziness and giddinessDizziness and giddiness; Start Date : 04/22/2022Dizziness and giddiness; Start Date : 01/26/2022Dizziness and giddiness; Start Date : 12/17/2021Dizziness and giddiness; Start Date : 07/05/2020     Dizziness    Electrocardiogram abnormal 03/29/2022   Abnormal electrocardiogram [ECG] [EKG]     Encounter for examination of eyes and vision without abnormal findings 03/29/2022   Encounter for exam of eyes and vision w/o abnormal findingsEncounter for exam of eyes and vision w/o abnormal findings; Start Date : 07/26/2022Encounter for exam of eyes and vision w/o abnormal findings; Start Date : 04/29/2021Encounter for exam of  eyes and vision w/o abnormal findings; Start Date : 02/24/2019     Encounter for screening for cardiovascular disorders 07/22/2023   Encounter for screening for cardiovascular disordersEncounter for screening for cardiovascular disorders; Start Date : 03/29/2022     Essential hypertension    Fatigue    History of kidney stones    Hyperlipidemia    Hypertension    Impaired cognition    Inflammatory dermatosis    Kidney stone    Low testosterone    Other specified health status 04/11/2023   Other specified health status     Pain in left foot 12/17/2019   Pain in left foot     Pain in right foot 12/17/2019   Pain in right foot     Pre-diabetes    Shortness of breath    Sleep apnea    does not use CPAP   Spasm 07/09/2022   Cramp and spasmCramp and spasm; Start Date : 02/24/2019     Testicular hypofunction    Tight heel cords, acquired, bilateral 04/15/2020   Unstable angina pectoris (HCC) 08/23/2020   Vitamin D deficiency    Past Surgical History:  Past Surgical History:  Procedure Laterality Date   APPENDECTOMY     2018   CATARACT EXTRACTION W/ INTRAOCULAR LENS IMPLANT Right 06/18/2023   COLONOSCOPY     CORONARY ARTERY BYPASS GRAFT N/A 06/02/2024   Procedure: CORONARY ARTERY BYPASS GRAFTING X 4, USING LEFT INTERNAL MAMMARY ARTERY AND ENDOSCOPIC HARVESTED RIGHT AND LEFT GREATER SAPHENOUS VEIN;  Surgeon: Daniel Con RAMAN, MD;  Location: [facility] OR;  Service: Open Heart Surgery;  Laterality: N/A;   EYE SURGERY     bilateral cataracts  removed   HERNIA REPAIR     INTRAOPERATIVE TRANSESOPHAGEAL ECHOCARDIOGRAM N/A 06/02/2024   Procedure: ECHOCARDIOGRAM, TRANSESOPHAGEAL, INTRAOPERATIVE;  Surgeon: Daniel Con RAMAN, MD;  Location: [facility] OR;  Service: Open Heart Surgery;  Laterality: N/A;   LITHOTRIPSY     x several   NECK SURGERY     1988, 2008   RIGHT/LEFT HEART CATH AND CORONARY ANGIOGRAPHY N/A 05/20/2024   Procedure: RIGHT/LEFT HEART CATH AND CORONARY ANGIOGRAPHY;  Surgeon: Ladona Heinz, MD;   Location: [facility];  Service: Cardiovascular;  Laterality: N/A;   HPI:  73 yo male presenting for CABG x4 on 10/14. Pt passed a swallow screen on 10/14 after extubation but felt like he was having dysphagia, so swallow eval was ordered 10/16. PMH includes: arthritis, BPH, sleep apnea, pre-diabetes, HTN, HLD, CAD, impaired cognition, neck surgery (1988, 2008)    Assessment / Plan / Recommendation  Clinical Impression  Pt's oropharyngeal swallow appears to be [facility] and he has no overt signs of aspiration. His oral motor exam is non-revealing and his vocal quality appears to be at his baseline. Pt's primary complaint is that he has to slow down his rate of eating and drinking because he is feels like he will vomit if he goes quickly (note that he denies any h/o GERD or esophageal issues). Educated pt and family on aspiration precautions which generally include a slower rate anyway. This are likely helpful strategies in his early days post-op especially while his cough is still guarded. No further SLP f/u indicated at this time.   SLP Visit Diagnosis: Dysphagia, unspecified (R13.10)    Aspiration Risk       Diet Recommendation Regular;Thin liquid    Liquid Administration via: Cup;Straw Medication Administration: Whole meds with liquid Supervision: Patient able to self feed Compensations: Slow rate;Small sips/bites;Follow solids with liquid Postural Changes: Seated upright at 90 degrees;Remain upright for at least 30 minutes after po intake    Other  Recommendations Oral Care Recommendations: Oral care BID     Assistance Recommended at Discharge    Functional Status Assessment    Frequency and Duration            Prognosis Prognosis for improved oropharyngeal function: Good      Swallow Study   General HPI: 73 yo male presenting for CABG x4 on 10/14. Pt passed a swallow screen on 10/14 after extubation but felt like he was having dysphagia, so swallow eval was ordered 10/16.  PMH includes: arthritis, BPH, sleep apnea, pre-diabetes, HTN, HLD, CAD, impaired cognition, neck surgery (1988, 2008) Type of Study: Bedside Swallow Evaluation Previous Swallow Assessment: none in chart Diet Prior to this Study: Regular;Thin liquids (Level 0) Temperature Spikes Noted: No Respiratory Status: Nasal cannula History of Recent Intubation: Yes Total duration of intubation (days): 2 days Date extubated: 06/02/24 Behavior/Cognition: Alert;Cooperative;Pleasant mood Oral Cavity Assessment: Within Functional Limits Oral Care Completed by SLP: No Oral Cavity - Dentition: Adequate natural dentition;Missing dentition Vision: Functional for self-feeding Self-Feeding Abilities: Able to feed self Patient Positioning: Upright in bed Baseline Vocal Quality: Normal Volitional Cough: Weak Volitional Swallow: Able to elicit    Oral/Motor/Sensory Function Overall Oral Motor/Sensory Function: Within functional limits   Ice Chips Ice chips: Not tested   Thin Liquid Thin Liquid: Within functional limits Presentation: Self Fed;Straw    Nectar Thick Nectar Thick Liquid: Not tested   Honey Thick Honey Thick Liquid: Not tested   Puree Puree: Not tested (pt declined)   Solid     Solid:  Within functional limits      Leita SAILOR., M.A. CCC-SLP Acute Rehabilitation Services Office: [phone]  Secure chat preferred  06/04/2024,1:00 PM

## 2024-06-04 NOTE — Plan of Care (Signed)
 Progressing Add All Education: Knowledge of General Education information will improve Add Today at 2305 - Progressing by Rolfe Corean HERO, RN Add Health Behavior/Discharge Planning: Ability to manage health-related needs will improve Add Today at 2305 - Progressing by Rolfe Corean HERO, RN Add Clinical Measurements: Ability to maintain clinical measurements within normal limits will improve Add Today at 2305 - Progressing by Rolfe Corean HERO, RN Add Will remain free from infection Add Today at 2305 - Progressing by Rolfe Corean HERO, RN Add Diagnostic test results will improve Add Today at 2305 - Progressing by Rolfe Corean HERO, RN Add Respiratory complications will improve Add Today at 2305 - Progressing by Rolfe Corean HERO, RN Add Cardiovascular complication will be avoided Add Today at 2305 - Progressing by Rolfe Corean HERO, RN Add Activity: Risk for activity intolerance will decrease Add Today at 2305 - Progressing by Rolfe Corean HERO, RN Add Nutrition: Adequate nutrition will be maintained Add Today at 2305 - Progressing by Rolfe Corean HERO, RN Add Coping: Level of anxiety will decrease Add Today at 2305 - Progressing by Rolfe Corean HERO, RN Add Elimination: Will not experience complications related to bowel motility Add Today at 2305 - Progressing by Rolfe Corean HERO, RN Add Will not experience complications related to urinary retention Add Today at 2305 - Progressing by Rolfe Corean HERO, RN Add Pain Managment: General experience of comfort will improve and/or be controlled Add Today at 2305 - Progressing by Rolfe Corean HERO, RN Add Safety: Ability to remain free from injury will improve Add Today at 2305 - Progressing by Rolfe Corean HERO, RN Add Skin Integrity: Risk for impaired skin integrity will decrease Add Today at 2305 - Progressing by Rolfe Corean HERO, RN Add Education: Will demonstrate proper wound care and an understanding of methods to prevent future damage Add Today at 2305 - Progressing by Rolfe Corean HERO, RN Add Knowledge of disease or condition will improve Add Today at 2305 - Progressing by Rolfe Corean HERO, RN Add Knowledge of the prescribed therapeutic regimen will improve Add Today at 2305 - Progressing by Rolfe Corean HERO, RN Add Individualized Educational Video(s) Add Today at 2305 - Progressing by Rolfe Corean HERO, RN Add Activity: Risk for activity intolerance will decrease Add Today at 2305 - Progressing by Rolfe Corean HERO, RN Add Cardiac: Will achieve and/or maintain hemodynamic stability Add Today at 2305 - Progressing by Rolfe Corean HERO, RN Add Clinical Measurements: Postoperative complications will be avoided or minimized Add Today at 2305 - Progressing by Rolfe Corean HERO, RN Add Respiratory: Respiratory status will improve Add Today at 2305 - Progressing by Rolfe Corean HERO, RN Add Skin Integrity: Wound healing without signs and symptoms of infection Add Today at 2305 - Progressing by Rolfe Corean HERO, RN Add Risk for impaired skin integrity will decrease Add Today at 2305 - Progressing by Rolfe Corean HERO, RN Add Urinary Elimination: Ability to achieve and maintain adequate renal perfusion and functioning will improve Add Today at 2305 - Progressing by Rolfe Corean HERO, RN Add Education: Ability to describe self-care measures that may prevent or decrease complications (Diabetes Survival Skills Education) will improve Add Today at 2305 - Progressing by Rolfe Corean HERO, RN Add Individualized Educational Video(s) Add Today at 2305 - Progressing by Rolfe Corean HERO, RN Add Coping: Ability to adjust to condition or change in health will improve Add Today at 2305 - Progressing by Rolfe Corean HERO, RN Add Fluid Volume: Ability to maintain a balanced intake  and output will improve Add Today at 2305 - Progressing by Rolfe Corean HERO, RN Add Health Behavior/Discharge Planning: Ability to identify and utilize available resources and services will improve Add Today at 2305 - Progressing by Rolfe Corean HERO, RN Add Ability to manage health-related needs will improve Add Today at 2305 - Progressing by Rolfe Corean HERO, RN Add Metabolic: Ability to maintain appropriate glucose levels will improve Add Today at 2305 - Progressing by Rolfe Corean HERO, RN Add Nutritional: Maintenance of adequate nutrition will improve Add Today at 2305 - Progressing by Rolfe Corean HERO, RN Add Progress toward achieving an optimal weight will improve Add Today at 2305 - Progressing by Rolfe Corean HERO, RN Add Skin Integrity: Risk for impaired skin integrity will decrease Add Today at 2305 - Progressing by Rolfe Corean HERO, RN Add Tissue Perfusion: Adequacy of tissue perfusion will improve Add Today at 2305 - Progressing by Rolfe Corean HERO, RN

## 2024-06-04 NOTE — Progress Notes (Signed)
 2 Days Post-Op Procedure(s) (LRB): CORONARY ARTERY BYPASS GRAFTING X 4, USING LEFT INTERNAL MAMMARY ARTERY AND ENDOSCOPIC HARVESTED RIGHT AND LEFT GREATER SAPHENOUS VEIN (N/A) ECHOCARDIOGRAM, TRANSESOPHAGEAL, INTRAOPERATIVE (N/A) Subjective: Increased oxygen requirement overnight but able to walk 2 times overnight Reports feeling ok, dyspnea with exertion but none at rest No pressors, A-line is out Weight still up 20 pounds from pre-op  Objective: Vital signs in last 24 hours: BP 103/68   Pulse 69   Temp 98.4 F (36.9 C) (Oral)   Resp (!) 9   Ht 5' 5 (1.651 m)   Wt 76.9 kg   SpO2 94%   BMI 28.21 kg/m  Filed Weights   06/02/24 0615 06/03/24 0600 06/04/24 0500  Weight: 68 kg 77.4 kg 76.9 kg    Hemodynamic parameters for last 24 hours: CVP:  [8 mmHg-34 mmHg] 26 mmHg CO:  [3.9 L/min-4.6 L/min] 4.3 L/min CI:  [2.2 L/min/m2-2.6 L/min/m2] 2.4 L/min/m2  Intake/Output from previous day: 10/15 0701 - 10/16 0700 In: 912.8 [P.O.:100; I.V.:363; IV Piggyback:449.8] Out: 1560 [Urine:1050; Chest Tube:510] Intake/Output this shift: Total I/O In: 0  Out: 65 [Urine:55; Chest Tube:10]  Physical Exam: General - Resting comfortably in chair CV - RRR, 60-70s Resp - Unlabored on HFNC Abd - Soft, ND/NT Ext - Mild leg edema, significant upper extremity edema  Lab Results:    Latest Ref Rng & Units 06/04/2024    4:29 AM 06/03/2024    5:00 PM 06/03/2024    6:18 AM  CBC  WBC 4.0 - 10.5 K/uL 8.2  9.0  7.9   Hemoglobin 13.0 - 17.0 g/dL 7.6  8.4  8.1   Hematocrit 39.0 - 52.0 % 23.5  25.1  23.8   Platelets 150 - 400 K/uL 74  82  89       Latest Ref Rng & Units 06/04/2024    4:29 AM 06/03/2024    5:00 PM 06/03/2024    6:18 AM  CMP  Glucose 70 - 99 mg/dL 869  834  846   BUN 8 - 23 mg/dL 26  17  12    Creatinine 0.61 - 1.24 mg/dL 8.41  8.62  8.86   Sodium 135 - 145 mmol/L 136  138  139   Potassium 3.5 - 5.1 mmol/L 4.9  4.9  3.8   Chloride 98 - 111 mmol/L 100  101  103   CO2 22 - 32  mmol/L 27  25  23    Calcium  8.9 - 10.3 mg/dL 7.7  7.9  7.3     CXR: Pulmonary edema, tiny left PTX  Assessment/Plan: S/P Procedure(s) (LRB): CORONARY ARTERY BYPASS GRAFTING X 4, USING LEFT INTERNAL MAMMARY ARTERY AND ENDOSCOPIC HARVESTED RIGHT AND LEFT GREATER SAPHENOUS VEIN (N/A) ECHOCARDIOGRAM, TRANSESOPHAGEAL, INTRAOPERATIVE (N/A) POD2 s/p CABG x 4 NEURO- intact  Pain control PRN Deconditioning- PT/OT CV- in SR around 60-70 bpm             Keep pacing wires, can pace higher for pressure if he needs it later today RESP- Pulmonary edema on XR likely due to volume overload             Continue IS, pulm hygiene, ambulation  Remove mediastinal tubes, keep left Chest tube RENAL- creatinine up to 1.6 from 1.3, and lytes Ok  Weight up 20 lbs             Start 80IV Lasix, may consider drip later if necessary  Foley will stay today GI- tolerating diet, no bowel function yet  Complains of dysphagia, will get speech eval  BM: Escalate BM, no gas or BM yet Endo- BG well controlled Transition to ACHS ISS ID- NAI DVT ppx - SCD + no DVT ppx yet given low platelets and coagulopathy   Dispo: ICU   LOS: 2 days    Con RAMAN Waller Marcussen 06/04/2024

## 2024-06-04 NOTE — Progress Notes (Signed)
   [address] [place] [address]             [phone]   POD # 2  Some nausea  BP 116/71   Pulse 80   Temp 97.8 F (36.6 C) (Oral)   Resp 11   Ht 5' 5 (1.651 m)   Wt 76.9 kg   SpO2 93%   BMI 28.21 kg/m    Intake/Output Summary (Last 24 hours) at 06/04/2024 1734 Last data filed at 06/04/2024 1500 Gross per 24 hour  Intake 213.34 ml  Output 1975 ml  Net -1761.66 ml   Creatinine 1.62 (1.58 earlier) K 4.2  Has walked twice today  Elspeth C. Kerrin, MD [facility] [phone]

## 2024-06-04 NOTE — Progress Notes (Signed)
 NAME:  Dustin Sloan, MRN:  [id number], DOB:  July 17, 1951, LOS: 2 ADMISSION DATE:  06/02/2024, CONSULTATION DATE:  10/14 REFERRING MD:  Dr. Daniel, [facility] CHIEF COMPLAINT:  s/p CABG  History of Present Illness:  73 year old male with past medical history of arthritis, BPH, sleep apnea not on CPAP, pre-diabetes, hypertension, hyperlipidemia with statin intolerance, CAD who presents to [facility] for CABG. Initially evaluated by cardiology for shortness of breath. He had TTE from PCP on 01/2024 with LVEF 50-55%, G1DD, trace AI, mild MR/TR. From there, was given nuclear stress test with abnormal EKG and referred for cardiac CT. 04/28/2024 had coronary CT with severe CAD, coronary calcium  score 3227. Had LHC 10/1 with severe and complex proximal LAD stenosis, high-grade OM2 stenosis and PDA. He was referred to [facility]. He had repeat echo 10/7 with LVEF 60-65%, G1DD.   Pump time: 2h 43m Xclamp time: 1h 54m EBL: 350cc (verbal report of moderate bleeding from leg on bypass, not calculated) Cell saver: 550cc UOP: 2400cc 750cc albumin, 3 PRBC, 2 cryo, 1 plt, 2900ccLR  Pertinent  Medical History  arthritis, BPH, sleep apnea not on CPAP, pre-diabetes, hypertension, hyperlipidemia with statin intolerance, CAD  Significant Hospital Events: Including procedures, antibiotic start and stop dates in addition to other pertinent events   10/14: admit post op to CCU for CABG x 4 10/15 got blood extubated 10/17 pushing diuresis; mediastinal tubes removed   Interim History / Subjective:  Patient received 1 unit PRBC overnight, he was successfully extubated per rapid weaning protocol Continue to complain of surgical site pain Denies nausea and vomiting  Objective   Blood pressure 103/68, pulse 69, temperature 98.4 F (36.9 C), temperature source Oral, resp. rate (!) 9, height 5' 5 (1.651 m), weight 76.9 kg, SpO2 94%.        Intake/Output Summary (Last 24 hours) at 06/04/2024 1011 Last data filed at 06/04/2024 0800 Gross  per 24 hour  Intake 673.44 ml  Output 1310 ml  Net -636.56 ml   Filed Weights   06/02/24 0615 06/03/24 0600 06/04/24 0500  Weight: 68 kg 77.4 kg 76.9 kg    Examination: General sitting up in chair not in distress HENT NCAT the right internal jugular cordis and CVL dressing is exposed but no evidence of infection  Pulm crackles both bases. Currently 6 lpm [place] PCXR w/ bibasilar atx. CT good position. Small left apical ptx CT w/ 1560 total output since surg. Slowing down  Card rrr  Abd soft  Ext diffuse anasarca pulses are palp  Neuro intact       Resolved Hospital Problem list   Hypocalcemia  Assessment & Plan:  Multivessel coronary artery disease s/p CABG x 4 Post cardiotomy vasoplegia (resolved) +4.5 liters net plan Continue aspirin  and Zetia (Patient is intolerant to statins) Chest tube management TCTS; getting mediastinal tubes removed today  Continue multimodality pain management with tramadol, oxycodone and morphine Closely monitor chest tube output Cont lasix   Acute post operative respiratory failure Continues to have significant post-operative O2 needs. 20 lbs up Has some L>R atx; vol loss. Feel like atelectasis driving factor over lung water Plan Cont to mobilize Pulm hygiene  Analgesia  Wean O2 for sats > 93% Aggressive diuresis. Bumped lasix to 80mg  but if not sig inc in UOP will start lasix gtt  Small left apical PTX Plan Keeping pleural tube Am cxr   Chronic HFpEF EF 60 to 65% with grade 1 diastolic dysfunction Plan GDMT once able to tolerate For now pushing diuresis  AKI Plan Pushing diuresis  Avoid hypotension Renal dose medst Strict I&O  Am chem   BPH Plan Keep foley for now given aggressive diuresis Continue home tamsulosin 0.4mg  TID   H/o Hypertension Plan Holding scheduled antihypertensives  Hyperlipidemia Plan sContinue Zetia  Diabetes type 2 Patient hemoglobin A1c is 5.5 Plan Ssi AC and HS  Goal 140-180    Expected perioperative blood loss anemia Thrombocytopenia due to CPB Coagulopathy Received 2 units PRBCs, platelets, cryo and FFP's  PLTs and hgb trending down some.  plan Cont to trend Transfuse if < 7 OR develops active bleeding

## 2024-06-05 ENCOUNTER — Inpatient Hospital Stay (HOSPITAL_COMMUNITY)

## 2024-06-05 DIAGNOSIS — J9601 Acute respiratory failure with hypoxia: Secondary | ICD-10-CM

## 2024-06-05 DIAGNOSIS — S0990XA Unspecified injury of head, initial encounter: Secondary | ICD-10-CM | POA: Diagnosis not present

## 2024-06-05 DIAGNOSIS — J9811 Atelectasis: Secondary | ICD-10-CM

## 2024-06-05 LAB — CBC
HCT: 21.9 % — ABNORMAL LOW (ref 39.0–52.0)
HCT: 27.7 % — ABNORMAL LOW (ref 39.0–52.0)
Hemoglobin: 7.2 g/dL — ABNORMAL LOW (ref 13.0–17.0)
Hemoglobin: 8.9 g/dL — ABNORMAL LOW (ref 13.0–17.0)
MCH: 30 pg (ref 26.0–34.0)
MCH: 29.6 pg (ref 26.0–34.0)
MCHC: 32.9 g/dL (ref 30.0–36.0)
MCHC: 32.1 g/dL (ref 30.0–36.0)
MCV: 91.3 fL (ref 80.0–100.0)
MCV: 92 fL (ref 80.0–100.0)
Platelets: 79 K/uL — ABNORMAL LOW (ref 150–400)
Platelets: 116 K/uL — ABNORMAL LOW (ref 150–400)
RBC: 2.4 MIL/uL — ABNORMAL LOW (ref 4.22–5.81)
RBC: 3.01 MIL/uL — ABNORMAL LOW (ref 4.22–5.81)
RDW: 17.4 % — ABNORMAL HIGH (ref 11.5–15.5)
RDW: 16.9 % — ABNORMAL HIGH (ref 11.5–15.5)
WBC: 7.4 K/uL (ref 4.0–10.5)
WBC: 8.6 K/uL (ref 4.0–10.5)
nRBC: 0 % (ref 0.0–0.2)
nRBC: 0.3 % — ABNORMAL HIGH (ref 0.0–0.2)

## 2024-06-05 LAB — BASIC METABOLIC PANEL WITH GFR
Anion gap: 9 (ref 5–15)
Anion gap: 13 (ref 5–15)
BUN: 36 mg/dL — ABNORMAL HIGH (ref 8–23)
BUN: 38 mg/dL — ABNORMAL HIGH (ref 8–23)
CO2: 29 mmol/L (ref 22–32)
CO2: 29 mmol/L (ref 22–32)
Calcium: 7.7 mg/dL — ABNORMAL LOW (ref 8.9–10.3)
Calcium: 8.4 mg/dL — ABNORMAL LOW (ref 8.9–10.3)
Chloride: 98 mmol/L (ref 98–111)
Chloride: 97 mmol/L — ABNORMAL LOW (ref 98–111)
Creatinine, Ser: 1.73 mg/dL — ABNORMAL HIGH (ref 0.61–1.24)
Creatinine, Ser: 1.63 mg/dL — ABNORMAL HIGH (ref 0.61–1.24)
GFR, Estimated: 41 mL/min — ABNORMAL LOW (ref >60)
GFR, Estimated: 44 mL/min — ABNORMAL LOW (ref >60)
Glucose, Bld: 115 mg/dL — ABNORMAL HIGH (ref 70–99)
Glucose, Bld: 173 mg/dL — ABNORMAL HIGH (ref 70–99)
Potassium: 3.7 mmol/L (ref 3.5–5.1)
Potassium: 3.8 mmol/L (ref 3.5–5.1)
Sodium: 136 mmol/L (ref 135–145)
Sodium: 139 mmol/L (ref 135–145)

## 2024-06-05 LAB — MAGNESIUM: Magnesium: 2.2 mg/dL (ref 1.7–2.4)

## 2024-06-05 LAB — GLUCOSE, CAPILLARY
Glucose-Capillary: 140 mg/dL — ABNORMAL HIGH (ref 70–99)
Glucose-Capillary: 120 mg/dL — ABNORMAL HIGH (ref 70–99)
Glucose-Capillary: 141 mg/dL — ABNORMAL HIGH (ref 70–99)
Glucose-Capillary: 125 mg/dL — ABNORMAL HIGH (ref 70–99)

## 2024-06-05 LAB — FIBRINOGEN: Fibrinogen: 564 mg/dL — ABNORMAL HIGH (ref 210–475)

## 2024-06-05 LAB — PROTIME-INR
INR: 1.1 (ref 0.8–1.2)
Prothrombin Time: 14.7 s (ref 11.4–15.2)

## 2024-06-05 MED ORDER — POTASSIUM CHLORIDE 20 MEQ PO PACK
20.0000 meq | PACK | ORAL | Status: AC
  Administered 2024-06-05 (×2): 20 meq via ORAL
  Filled 2024-06-05 (×2): qty 1

## 2024-06-05 MED ORDER — SORBITOL 70 % SOLN
30.0000 mL | Freq: Once | Status: AC
  Administered 2024-06-05: 30 mL via ORAL
  Filled 2024-06-05: qty 30

## 2024-06-05 MED ORDER — FUROSEMIDE 10 MG/ML IJ SOLN
80.0000 mg | Freq: Two times a day (BID) | INTRAMUSCULAR | Status: AC
  Administered 2024-06-05 (×2): 80 mg via INTRAVENOUS
  Filled 2024-06-05 (×2): qty 8

## 2024-06-05 MED ORDER — POTASSIUM CHLORIDE 20 MEQ PO PACK
20.0000 meq | PACK | ORAL | Status: DC
  Administered 2024-06-05: 20 meq
  Filled 2024-06-05: qty 1

## 2024-06-05 NOTE — Plan of Care (Signed)

## 2024-06-05 NOTE — Progress Notes (Signed)
 CT head reviewed No obvious bleeding or acute abnormality   Maude FORBES Banner ACNP-BC [facility] Pulmonary/Critical Care Pager # [phone] OR # [phone] if no answer

## 2024-06-05 NOTE — Progress Notes (Signed)
 NAME:  Dustin Sloan, MRN:  [id number], DOB:  June 07, 1951, LOS: 3 ADMISSION DATE:  06/02/2024, CONSULTATION DATE:  10/14 REFERRING MD:  Dr. Daniel, [facility] CHIEF COMPLAINT:  s/p CABG  History of Present Illness:  73 year old male with past medical history of arthritis, BPH, sleep apnea not on CPAP, pre-diabetes, hypertension, hyperlipidemia with statin intolerance, CAD who presents to [facility] for CABG. Initially evaluated by cardiology for shortness of breath. He had TTE from PCP on 01/2024 with LVEF 50-55%, G1DD, trace AI, mild MR/TR. From there, was given nuclear stress test with abnormal EKG and referred for cardiac CT. 04/28/2024 had coronary CT with severe CAD, coronary calcium  score 3227. Had LHC 10/1 with severe and complex proximal LAD stenosis, high-grade OM2 stenosis and PDA. He was referred to TCTS. He had repeat echo 10/7 with LVEF 60-65%, G1DD.   Pump time: 2h 23m Xclamp time: 1h 34m EBL: 350cc (verbal report of moderate bleeding from leg on bypass, not calculated) Cell saver: 550cc UOP: 2400cc 750cc albumin, 3 PRBC, 2 cryo, 1 plt, 2900ccLR  Pertinent  Medical History  arthritis, BPH, sleep apnea not on CPAP, pre-diabetes, hypertension, hyperlipidemia with statin intolerance, CAD  Significant Hospital Events: Including procedures, antibiotic start and stop dates in addition to other pertinent events   10/14: admit post op to CCU for CABG x 4 10/15 got blood extubated 10/16 pushing diuresis; mediastinal tubes removed    Interim History / Subjective:  No overnight issues Denies nausea and vomiting, passing gas but no bowel movement Stated no appetite but started eating some Walked in the hallway Stated pain is controlled today  Objective   Blood pressure 93/63, pulse 79, temperature 98.9 F (37.2 C), temperature source Oral, resp. rate 13, height 5' 5 (1.651 m), weight 74.4 kg, SpO2 98%.        Intake/Output Summary (Last 24 hours) at 06/05/2024 0912 Last data filed at 06/05/2024  0800 Gross per 24 hour  Intake 0 ml  Output 2300 ml  Net -2300 ml   Filed Weights   06/03/24 0600 06/04/24 0500 06/05/24 0500  Weight: 77.4 kg 76.9 kg 74.4 kg    Examination: General: Elderly male, sitting on recliner HEENT: [place]/AT, eyes anicteric.  moist mucus membranes.  Currently on nasal cannula oxygen Neuro: Alert, awake following commands Chest: Reduced air entry at the bases bilaterally.  Mediastinal chest tube and pacer wire in place Heart: Regular rate and rhythm, no murmurs or gallops Abdomen: Soft, nontender, nondistended, bowel sounds present  Labs and images reviewed  Patient Lines/Drains/Airways Status     Active Line/Drains/Airways     Name Placement date Placement time Site Days   Peripheral IV 06/02/24 18 G Left Antecubital 06/02/24  0634  Antecubital  3   CVC Triple Lumen 06/02/24 Right Internal jugular 06/02/24  --  -- 3   Urethral Catheter T. Wagoner RN Temperature probe;Latex 16 Fr. 06/02/24  0755  Temperature probe;Latex  3   Y Chest Tube 1, 2, and 3 1 Medial Mediastinal 28 Fr. 2 Medial Mediastinal 36 Fr. 3 Left Pleural 28 Fr. 06/02/24  1356  -- 3   Wound 06/02/24 0941 Surgical Closed Surgical Incision Chest Other (Comment) 06/02/24  0941  Chest  3   Wound 06/02/24 0941 Surgical Closed Surgical Incision Leg Left 06/02/24  0941  Leg  3   Wound 06/02/24 0941 Surgical Closed Surgical Incision Leg Right 06/02/24  0941  Leg  3        Resolved Hospital Problem list  Hypocalcemia  Post cardiotomy vasoplegia (resolved) Assessment & Plan:  Multivessel coronary artery disease s/p CABG x 4 Continue aspirin  and Zetia Mediastinal chest tubes were removed yesterday Will remove pacer wire and left-sided chest tube as there was minimal output from chest tube Received aggressive diuresis with 80 mg Lasix x 2 yesterday, put out 2.5 L of urine, weight is down by 5 pounds Continue Lasix 80 mg x 1 this morning, repeat BMP and then decide if he needs further  diuretics Continue multimodal pain management  Acute hypoxic respiratory failure in the setting of bilateral atelectasis and pulmonary edema Patient's oxygen requirement has improved, currently he is down to 4 L Continue to titrate oxygen with O2 sat goal 92% Encourage incentive spirometry and ambulation Continue diuresis  Small left apical PTX Closely monitor  Chronic HFpEF EF 60 to 65% with grade 1 diastolic dysfunction GDMT is limited by borderline low blood pressure  AKI Patient serum creatinine trended up to 1.7, with baseline of 0.9 Will diurese again with 80 mg of Lasix Repeat BMP in the noon Avoid nephrotoxic agent  BPH Continue home tamsulosin 0.4mg  TID   Hyperlipidemia Continue Zetia  Diabetes type 2 Patient hemoglobin A1c is 5.5 Continue sliding scale insulin with CBG goal 140-180  Expected perioperative blood loss anemia Thrombocytopenia due to CPB Coagulopathy Received 2 units PRBCs, platelets, cryo and FFP's  H&H trended down to 7.2, closely monitor Platelet counts are stable in the 70s     Valinda Novas, MD [facility] See Amion for pager If no response to pager, please call [phone] until 7pm After 7pm, Please call E-link [phone]

## 2024-06-05 NOTE — Progress Notes (Addendum)
 Called to bedside  Pt had attempted to get up to go to bathroom and lost his balance. When nursing found him he was laying on his right side. Denied any pain but did report that he had hit his head.   He was assisted back to bed by nursing staff. There was no obvious focal weakness  BP 123/80   Pulse 96   Temp (!) 97.5 F (36.4 C) (Axillary)   Resp 20   Ht 5' 5 (1.651 m)   Wt 74.4 kg   SpO2 92%   BMI 27.29 kg/m   General now laying in bed. Not in any distress. Denies any focal areas of pain associated w/ event  HENT NCAT smile symmetrical, EOMs intact, PERRL, speech is clear, no visual field loss. No JVD. MMM Pulm clear no accessory use. Actually saturating 90% on room air at rest which is sig improvement Card RRR sternal incision still well approximated. No tenderness  Abd soft Ext warm no new areas of tenderness or ecchymosis  Gu voids Neuro awake, oriented x 4, speech is clear, no focal def noted CNs intact   Impression  S/p mechanical fall. No arrhythmia noted on tele, denied any dizziness or pre-syncopal symptoms. Felt like he just tripped. Reported that he did strike his head pointing to the right posterior temporal region. No obvious bruising or hematoma.  His PLTs count was 79K today  He is on ASA but not currently on any anticoagulation   Plan Out of abundance of caution will get STAT non-contrasted CT head to r/o traumatic bleed Will have nursing staff continue q 1 h neuro checks x4, then q2 hrs x2 then q 4 Repeating cbc tonight in case we end up needing to think about PLTs, and sending additional coags as well just to ensure if any emergency were to arise we would be prepared to correct any coagulopathy or if plts lower   Maude FORBES Banner ACNP-BC [facility] Pulmonary/Critical Care Pager # [phone] OR # [phone] if no answer

## 2024-06-05 NOTE — Plan of Care (Signed)
 Progressing Add All Education: Knowledge of General Education information will improve Add Today at 2247 - Progressing by Rolfe Corean HERO, RN Add Health Behavior/Discharge Planning: Ability to manage health-related needs will improve Add Today at 2247 - Progressing by Rolfe Corean HERO, RN Add Clinical Measurements: Ability to maintain clinical measurements within normal limits will improve Add Today at 2247 - Progressing by Rolfe Corean HERO, RN Add Will remain free from infection Add Today at 2247 - Progressing by Rolfe Corean HERO, RN Add Diagnostic test results will improve Add Today at 2247 - Progressing by Rolfe Corean HERO, RN Add Respiratory complications will improve Add Today at 2247 - Progressing by Rolfe Corean HERO, RN Add Cardiovascular complication will be avoided Add Today at 2247 - Progressing by Rolfe Corean HERO, RN Add Activity: Risk for activity intolerance will decrease Add Today at 2247 - Progressing by Rolfe Corean HERO, RN Add Nutrition: Adequate nutrition will be maintained Add Today at 2247 - Progressing by Rolfe Corean HERO, RN Add Coping: Level of anxiety will decrease Add Today at 2247 - Progressing by Rolfe Corean HERO, RN Add Elimination: Will not experience complications related to bowel motility Add Today at 2247 - Progressing by Rolfe Corean HERO, RN Add Will not experience complications related to urinary retention Add Today at 2247 - Progressing by Rolfe Corean HERO, RN Add Pain Managment: General experience of comfort will improve and/or be controlled Add Today at 2247 - Progressing by Rolfe Corean HERO, RN Add Safety: Ability to remain free from injury will improve Add Today at 2247 - Progressing by Rolfe Corean HERO, RN Add Skin Integrity: Risk for impaired skin integrity will decrease Add Today at 2247 - Progressing by Rolfe Corean HERO, RN Add Education: Will demonstrate proper wound care and an understanding of methods to prevent future damage Add Today at 2247 - Progressing by Rolfe Corean HERO, RN Add Knowledge of disease or condition will improve Add Today at 2247 - Progressing by Rolfe Corean HERO, RN Add Knowledge of the prescribed therapeutic regimen will improve Add Today at 2247 - Progressing by Rolfe Corean HERO, RN Add Individualized Educational Video(s) Add Today at 2247 - Progressing by Rolfe Corean HERO, RN Add Activity: Risk for activity intolerance will decrease Add Today at 2247 - Progressing by Rolfe Corean HERO, RN Add Cardiac: Will achieve and/or maintain hemodynamic stability Add Today at 2247 - Progressing by Rolfe Corean HERO, RN Add Clinical Measurements: Postoperative complications will be avoided or minimized Add Today at 2247 - Progressing by Rolfe Corean HERO, RN Add Respiratory: Respiratory status will improve Add Today at 2247 - Progressing by Rolfe Corean HERO, RN Add Skin Integrity: Wound healing without signs and symptoms of infection Add Today at 2247 - Progressing by Rolfe Corean HERO, RN Add Risk for impaired skin integrity will decrease Add Today at 2247 - Progressing by Rolfe Corean HERO, RN Add Urinary Elimination: Ability to achieve and maintain adequate renal perfusion and functioning will improve Add Today at 2247 - Progressing by Rolfe Corean HERO, RN Add Education: Ability to describe self-care measures that may prevent or decrease complications (Diabetes Survival Skills Education) will improve Add Today at 2247 - Progressing by Rolfe Corean HERO, RN Add Individualized Educational Video(s) Add Today at 2247 - Progressing by Rolfe Corean HERO, RN Add Coping: Ability to adjust to condition or change in health will improve Add Today at 2247 - Progressing by Rolfe Corean HERO, RN Add Fluid Volume: Ability to maintain a balanced intake  and output will improve Add Today at 2247 - Progressing by Rolfe Corean HERO, RN Add Health Behavior/Discharge Planning: Ability to identify and utilize available resources and services will improve Add Today at 2247 - Progressing by Rolfe Corean HERO, RN Add Ability to manage health-related needs will improve Add Today at 2247 - Progressing by Rolfe Corean HERO, RN Add Metabolic: Ability to maintain appropriate glucose levels will improve Add Today at 2247 - Progressing by Rolfe Corean HERO, RN Add Nutritional: Maintenance of adequate nutrition will improve Add Today at 2247 - Progressing by Rolfe Corean HERO, RN Add Progress toward achieving an optimal weight will improve Add Today at 2247 - Progressing by Rolfe Corean HERO, RN Add Skin Integrity: Risk for impaired skin integrity will decrease Add Today at 2247 - Progressing by Rolfe Corean HERO, RN Add Tissue Perfusion: Adequacy of tissue perfusion will improve Add Today at 2247 - Progressing by Rolfe Corean HERO, RN

## 2024-06-05 NOTE — Progress Notes (Addendum)
 [facility] DAILY ICU PROGRESS NOTE                   [address]            [place] [address]          [phone]   3 Days Post-Op Procedure(s) (LRB): CORONARY ARTERY BYPASS GRAFTING X 4, USING LEFT INTERNAL MAMMARY ARTERY AND ENDOSCOPIC HARVESTED RIGHT AND LEFT GREATER SAPHENOUS VEIN (N/A) ECHOCARDIOGRAM, TRANSESOPHAGEAL, INTRAOPERATIVE (N/A)  Total Length of Stay:  LOS: 3 days   Subjective: He already walked this am. He is passing flatus but no bowel movement yet.  Objective: Vital signs in last 24 hours: Temp:  [97.8 F (36.6 C)-99.3 F (37.4 C)] 98.9 F (37.2 C) (10/17 0744) Pulse Rate:  [65-101] 77 (10/17 0700) Cardiac Rhythm: Normal sinus rhythm (10/17 0000) Resp:  [7-28] 22 (10/17 0700) BP: (92-133)/(49-87) 101/69 (10/17 0700) SpO2:  [89 %-99 %] 95 % (10/17 0700) Weight:  [74.4 kg] 74.4 kg (10/17 0500)  Filed Weights   06/03/24 0600 06/04/24 0500 06/05/24 0500  Weight: 77.4 kg 76.9 kg 74.4 kg    Weight change: -2.5 kg      Intake/Output from previous day: 10/16 0701 - 10/17 0700 In: 0  Out: 2565 [Urine:2495; Chest Tube:70]  Intake/Output this shift: Total I/O In: -  Out: 100 [Urine:100]  Current Meds: Scheduled Meds:  acetaminophen  1,000 mg Oral Q6H   aspirin  EC  81 mg Oral Daily   Chlorhexidine Gluconate Cloth  6 each Topical Daily   ezetimibe  10 mg Oral Q1500   gabapentin  100 mg Oral BID   insulin aspart  0-15 Units Subcutaneous TID WC   insulin aspart  0-5 Units Subcutaneous QHS   lidocaine  1 patch Transdermal Q24H   pantoprazole (PROTONIX) IV  40 mg Intravenous Q24H   polyethylene glycol  17 g Oral BID   potassium chloride  20 mEq Per Tube Q4H   rosuvastatin   20 mg Oral QHS   senna-docusate  2 tablet Oral QHS   sodium chloride flush  10-40 mL Intracatheter Q12H   sodium chloride flush  3 mL Intravenous Q12H   tamsulosin  0.4 mg Oral Q72H   Continuous Infusions:  albumin human 999 mL/hr at 06/04/24 0800   PRN  Meds:.albumin human, albuterol, hydrOXYzine, metoprolol  tartrate, morphine injection, ondansetron (ZOFRAN) IV, mouth rinse, sodium chloride flush, sodium chloride flush, traMADol  General appearance: alert, cooperative, and no distress Neurologic: intact Heart: RRR Lungs: Slightly diminished bibasilar breath sounds Abdomen: Soft, slightly distended, non tender, sporadic bowel sounds Extremities: Mild bilateral LE edemea Wounds: Sternal dressing partially removed and sternal wound is clean and dry. Bilateral LE wounds are clean and dry. Ecchymosis thighs L>R  Lab Results: CBC: Recent Labs    06/04/24 0429 06/05/24 0314  WBC 8.2 7.4  HGB 7.6* 7.2*  HCT 23.5* 21.9*  PLT 74* 79*   BMET:  Recent Labs    06/04/24 1427 06/05/24 0314  NA 138 136  K 4.2 3.7  CL 99 98  CO2 28 29  GLUCOSE 122* 115*  BUN 32* 36*  CREATININE 1.62* 1.73*  CALCIUM  7.9* 7.7*    CMET: Lab Results  Component Value Date   WBC 7.4 06/05/2024   HGB 7.2 (L) 06/05/2024   HCT 21.9 (L) 06/05/2024   PLT 79 (L) 06/05/2024   GLUCOSE 115 (H) 06/05/2024   CHOL 54 06/04/2024   TRIG 92 06/04/2024   HDL 20 (L) 06/04/2024  LDLCALC 16 06/04/2024   ALT 26 06/01/2024   AST 26 06/01/2024   NA 136 06/05/2024   K 3.7 06/05/2024   CL 98 06/05/2024   CREATININE 1.73 (H) 06/05/2024   BUN 36 (H) 06/05/2024   CO2 29 06/05/2024   TSH 3.200 08/23/2020   INR 2.5 (H) 06/02/2024   HGBA1C 5.5 06/01/2024      PT/INR:  Recent Labs    06/02/24 1557  LABPROT 28.4*  INR 2.5*   Radiology: No results found.   Assessment/Plan: S/P Procedure(s) (LRB): CORONARY ARTERY BYPASS GRAFTING X 4, USING LEFT INTERNAL MAMMARY ARTERY AND ENDOSCOPIC HARVESTED RIGHT AND LEFT GREATER SAPHENOUS VEIN (N/A) ECHOCARDIOGRAM, TRANSESOPHAGEAL, INTRAOPERATIVE (N/A) CV-SR, first degree heart block. SBP in the 90's this am so will not start BB yet Pulmonary-Chest tubes with 70 cc of output last 24 hours. On 6 L HFNC. CXR this am not  ordered so will order. Hope to remove chest tube today. Encourage incentive spirometer and flutter valve.  History of chronic HFpEF-He received 2 doses of Lasix 80 mg IV yesterday. Will diurese as BP allows and monitor creatinine Expected post op blood loss anemia-H and H this am 7.2 and 21.9. Monitor need for transfusion Thrombocytopenia-platelets this am 79,000 Both above secondary to CPB and coagulopathy. 6. AKI-Creatinine this am slightly increased to 1.73. Avoid nephro toxic agents 7. CBGs  128/118/140. Pre op HGA1C 5.5. Will stop accu checks and SS PRN after transfer to the floor.  8. GI-passing flatus, no BM yet. LOC constipation. No N/V or abdominal pain. Speech pathology evaluation done yesterday-regular, thin liquid diet. Other recommendations to be followed accordingly 9. Gently supplement potassium (3.7) 10. Patient still with foley and EPW. Likely remove foley today 11. Deconditioned-continue PT/OT  Kyla CHRISTELLA Donald PA-C 06/05/2024 8:03 AM  Doing well Diuresed well yesterday.  Creat slightly up.  Will continue diuresis today Will remove pacing wires and chest tube today Continue ICU care for now  Sonya Gunnoe [employer]

## 2024-06-06 ENCOUNTER — Inpatient Hospital Stay (HOSPITAL_COMMUNITY)

## 2024-06-06 LAB — BASIC METABOLIC PANEL WITH GFR
Anion gap: 11 (ref 5–15)
BUN: 35 mg/dL — ABNORMAL HIGH (ref 8–23)
CO2: 30 mmol/L (ref 22–32)
Calcium: 8.4 mg/dL — ABNORMAL LOW (ref 8.9–10.3)
Chloride: 95 mmol/L — ABNORMAL LOW (ref 98–111)
Creatinine, Ser: 1.53 mg/dL — ABNORMAL HIGH (ref 0.61–1.24)
GFR, Estimated: 48 mL/min — ABNORMAL LOW (ref >60)
Glucose, Bld: 130 mg/dL — ABNORMAL HIGH (ref 70–99)
Potassium: 3.6 mmol/L (ref 3.5–5.1)
Sodium: 136 mmol/L (ref 135–145)

## 2024-06-06 LAB — GLUCOSE, CAPILLARY
Glucose-Capillary: 113 mg/dL — ABNORMAL HIGH (ref 70–99)
Glucose-Capillary: 99 mg/dL (ref 70–99)
Glucose-Capillary: 199 mg/dL — ABNORMAL HIGH (ref 70–99)
Glucose-Capillary: 124 mg/dL — ABNORMAL HIGH (ref 70–99)

## 2024-06-06 LAB — CBC
HCT: 25.3 % — ABNORMAL LOW (ref 39.0–52.0)
Hemoglobin: 8.3 g/dL — ABNORMAL LOW (ref 13.0–17.0)
MCH: 29.9 pg (ref 26.0–34.0)
MCHC: 32.8 g/dL (ref 30.0–36.0)
MCV: 91 fL (ref 80.0–100.0)
Platelets: 111 K/uL — ABNORMAL LOW (ref 150–400)
RBC: 2.78 MIL/uL — ABNORMAL LOW (ref 4.22–5.81)
RDW: 16.5 % — ABNORMAL HIGH (ref 11.5–15.5)
WBC: 7.6 K/uL (ref 4.0–10.5)
nRBC: 0 % (ref 0.0–0.2)

## 2024-06-06 MED ORDER — FUROSEMIDE 40 MG PO TABS
40.0000 mg | ORAL_TABLET | Freq: Every day | ORAL | Status: DC
  Administered 2024-06-06 – 2024-06-08 (×3): 40 mg via ORAL
  Filled 2024-06-06 (×3): qty 1

## 2024-06-06 MED ORDER — SODIUM CHLORIDE 0.9% FLUSH
3.0000 mL | Freq: Two times a day (BID) | INTRAVENOUS | Status: DC
  Administered 2024-06-06 – 2024-06-07 (×4): 3 mL via INTRAVENOUS

## 2024-06-06 MED ORDER — FENTANYL CITRATE (PF) 50 MCG/ML IJ SOSY
25.0000 ug | PREFILLED_SYRINGE | Freq: Once | INTRAMUSCULAR | Status: DC
  Filled 2024-06-06: qty 1

## 2024-06-06 MED ORDER — INSULIN ASPART 100 UNIT/ML IJ SOLN: 0.0000 [IU] | Freq: Three times a day (TID) | INTRAMUSCULAR | Status: DC

## 2024-06-06 MED ORDER — SODIUM CHLORIDE 0.9% FLUSH: 3.0000 mL | INTRAVENOUS | Status: DC | PRN

## 2024-06-06 MED ORDER — ~~LOC~~ CARDIAC SURGERY, PATIENT & FAMILY EDUCATION: Freq: Once | Status: AC

## 2024-06-06 MED ORDER — POTASSIUM CHLORIDE 20 MEQ PO PACK
20.0000 meq | PACK | ORAL | Status: AC
  Administered 2024-06-06 (×2): 20 meq via ORAL
  Filled 2024-06-06 (×2): qty 1

## 2024-06-06 MED ORDER — PANTOPRAZOLE SODIUM 40 MG PO TBEC
40.0000 mg | DELAYED_RELEASE_TABLET | Freq: Every day | ORAL | Status: DC
  Administered 2024-06-07 – 2024-06-08 (×2): 40 mg via ORAL
  Filled 2024-06-06 (×2): qty 1

## 2024-06-06 MED ORDER — POTASSIUM CHLORIDE CRYS ER 20 MEQ PO TBCR
40.0000 meq | EXTENDED_RELEASE_TABLET | Freq: Every day | ORAL | Status: DC
  Administered 2024-06-06 – 2024-06-08 (×3): 40 meq via ORAL
  Filled 2024-06-06 (×3): qty 2

## 2024-06-06 MED ORDER — SORBITOL 70 % SOLN: 30.0000 mL | Freq: Once | Status: DC

## 2024-06-06 MED ORDER — SODIUM CHLORIDE 0.9 % IV SOLN: 250.0000 mL | INTRAVENOUS | Status: AC | PRN

## 2024-06-06 NOTE — Progress Notes (Signed)
 NAME:  Dustin Sloan, MRN:  [id number], DOB:  02/08/1951, LOS: 4 ADMISSION DATE:  06/02/2024, CONSULTATION DATE:  10/14 REFERRING MD:  Dr. Daniel, [facility] CHIEF COMPLAINT:  s/p CABG  History of Present Illness:  73 year old male with past medical history of arthritis, BPH, sleep apnea not on CPAP, pre-diabetes, hypertension, hyperlipidemia with statin intolerance, CAD who presents to [facility] for CABG. Initially evaluated by cardiology for shortness of breath. He had TTE from PCP on 01/2024 with LVEF 50-55%, G1DD, trace AI, mild MR/TR. From there, was given nuclear stress test with abnormal EKG and referred for cardiac CT. 04/28/2024 had coronary CT with severe CAD, coronary calcium  score 3227. Had LHC 10/1 with severe and complex proximal LAD stenosis, high-grade OM2 stenosis and PDA. He was referred to [facility]. He had repeat echo 10/7 with LVEF 60-65%, G1DD.   Pump time: 2h 64m Xclamp time: 1h 58m EBL: 350cc (verbal report of moderate bleeding from leg on bypass, not calculated) Cell saver: 550cc UOP: 2400cc 750cc albumin, 3 PRBC, 2 cryo, 1 plt, 2900ccLR  Pertinent  Medical History  arthritis, BPH, sleep apnea not on CPAP, pre-diabetes, hypertension, hyperlipidemia with statin intolerance, CAD  Significant Hospital Events: Including procedures, antibiotic start and stop dates in addition to other pertinent events   10/14: admit post op to CCU for CABG x 4 10/15 got blood extubated 10/16 pushing diuresis; mediastinal tubes removed  10/17 patient had a fall after he tripped with tubing, hit his head, had head CT done which showed no acute abnormality   Interim History / Subjective:  Patient stated he could not sleep overnight due to burning urination, stated it is better this morning Denies any other complaints, stated pain is controlled  Objective   Blood pressure 104/74, pulse 95, temperature 98.6 F (37 C), temperature source Oral, resp. rate (!) 22, height 5' 5 (1.651 m), weight 69.7 kg, SpO2  95%.        Intake/Output Summary (Last 24 hours) at 06/06/2024 0848 Last data filed at 06/06/2024 0600 Gross per 24 hour  Intake 0 ml  Output 4470 ml  Net -4470 ml   Filed Weights   06/04/24 0500 06/05/24 0500 06/06/24 0500  Weight: 76.9 kg 74.4 kg 69.7 kg    Examination: General: Elderly male, sitting on recliner HEENT: /AT, eyes anicteric.  moist mucus membranes Neuro: Alert, awake following commands Chest: Central sternotomy incision looks clean and dry, coarse breath sounds, no wheezes or rhonchi Heart: Regular rate and rhythm, no murmurs or gallops Abdomen: Soft, nontender, nondistended, bowel sounds present  Labs reviewed  Patient Lines/Drains/Airways Status     Active Line/Drains/Airways     Name Placement date Placement time Site Days   Peripheral IV 06/02/24 18 G Left Antecubital 06/02/24  0634  Antecubital  4   Wound 06/02/24 0941 Surgical Closed Surgical Incision Chest Other (Comment) 06/02/24  0941  Chest  4   Wound 06/02/24 0941 Surgical Closed Surgical Incision Leg Left 06/02/24  0941  Leg  4   Wound 06/02/24 0941 Surgical Closed Surgical Incision Leg Right 06/02/24  0941  Leg  4         Resolved Hospital Problem list   Hypocalcemia  Post cardiotomy vasoplegia (resolved) Assessment & Plan:  Multivessel coronary artery disease s/p CABG x 4 Continue aspirin  and Zetia He received Lasix 80 mg twice daily with 4.5 L urine output Stated feeling better Continue multimodal pain management Tubes and drains are out  Acute hypoxic respiratory failure in the setting  of bilateral atelectasis and pulmonary edema, resolved Patient is on room air  Small left apical PTX, resolved  Chronic HFpEF EF 60 to 65% with grade 1 diastolic dysfunction Continue metoprolol  Trial of Lasix again today  AKI Patient serum creatinine started trending down, currently at 1.5 from 1.7 yesterday He received IV Lasix 80 mg x 2 yesterday Probably will need a 80 mg of Lasix x  1 today, will discuss with [facility]  BPH Continue home tamsulosin 0.4mg  TID  Patient stated burning urination is better, probably irritation from Foley catheter which was removed yesterday  Hyperlipidemia Continue Zetia  Diabetes type 2 Patient hemoglobin A1c is 5.5 Blood sugars are controlled Continue sliding scale insulin with CBG goal 140-180  Expected perioperative blood loss anemia Thrombocytopenia due to CPB Coagulopathy Received 2 units PRBCs, platelets, cryo and FFP's  Hemoglobin is stable around 8.3 and platelet at 111  INR trended down to 1.1  Status post fall Patient had a fall yesterday after tripping with tubing Head CT is negative, denies headache or other complaints     Valinda Novas, MD [facility] See Amion for pager If no response to pager, please call [phone] until 7pm After 7pm, Please call E-link [phone]

## 2024-06-06 NOTE — Plan of Care (Signed)

## 2024-06-06 NOTE — Progress Notes (Signed)
      [address]       [place] [address]             [phone]                 4 Days Post-Op Procedure(s) (LRB): CORONARY ARTERY BYPASS GRAFTING X 4, USING LEFT INTERNAL MAMMARY ARTERY AND ENDOSCOPIC HARVESTED RIGHT AND LEFT GREATER SAPHENOUS VEIN (N/A) ECHOCARDIOGRAM, TRANSESOPHAGEAL, INTRAOPERATIVE (N/A)   Events: No events _______________________________________________________________ Vitals: BP 104/74 (BP Location: Right Arm)   Pulse 95   Temp 98.6 F (37 C) (Oral)   Resp (!) 22   Ht 5' 5 (1.651 m)   Wt 69.7 kg   SpO2 95%   BMI 25.57 kg/m  Filed Weights   06/04/24 0500 06/05/24 0500 06/06/24 0500  Weight: 76.9 kg 74.4 kg 69.7 kg     - Neuro: arousable, NAD  - Cardiovascular: sinus  Drips: none.      - Pulm: EWOB    ABG    Component Value Date/Time   PHART 7.313 (L) 06/03/2024 0006   PCO2ART 33.6 06/03/2024 0006   PO2ART 84 06/03/2024 0006   HCO3 17.0 (L) 06/03/2024 0006   TCO2 18 (L) 06/03/2024 0006   ACIDBASEDEF 8.0 (H) 06/03/2024 0006   O2SAT 57.8 06/04/2024 0723    - Abd: ND - Extremity: trace edema  .Intake/Output      10/17 0701 10/18 0700 10/18 0701 10/19 0700   IV Piggyback 0    Total Intake(mL/kg) 0 (0)    Urine (mL/kg/hr) 4550 (2.7)    Stool 0    Chest Tube 20    Total Output 4570    Net -4570         Stool Occurrence 1 x       _______________________________________________________________ Labs:    Latest Ref Rng & Units 06/06/2024    2:57 AM 06/05/2024    7:03 PM 06/05/2024    3:14 AM  CBC  WBC 4.0 - 10.5 K/uL 7.6  8.6  7.4   Hemoglobin 13.0 - 17.0 g/dL 8.3  8.9  7.2   Hematocrit 39.0 - 52.0 % 25.3  27.7  21.9   Platelets 150 - 400 K/uL 111  116  79       Latest Ref Rng & Units 06/06/2024    2:57 AM 06/05/2024    5:27 PM 06/05/2024    3:14 AM  CMP  Glucose 70 - 99 mg/dL 869  826  884   BUN 8 - 23 mg/dL 35  38  36   Creatinine 0.61 - 1.24 mg/dL 8.46  8.36  8.26   Sodium 135 - 145  mmol/L 136  139  136   Potassium 3.5 - 5.1 mmol/L 3.6  3.8  3.7   Chloride 98 - 111 mmol/L 95  97  98   CO2 22 - 32 mmol/L 30  29  29    Calcium  8.9 - 10.3 mg/dL 8.4  8.4  7.7     CXR: stable  _______________________________________________________________  Assessment and Plan: POD 4 s/p CABG  Neuro: pain controlled CV: on A/S/BB. Pulm: off O2.  IS, ambulation Renal: creat down.  Continue gentle diuresis GI: on diet Heme: stable ID: [email or web address] Endo: SSI Dispo: floor today   Dustin Sloan O Casady Voshell 06/06/2024 11:03 AM

## 2024-06-07 LAB — CBC
HCT: 24.7 % — ABNORMAL LOW (ref 39.0–52.0)
Hemoglobin: 8 g/dL — ABNORMAL LOW (ref 13.0–17.0)
MCH: 29.6 pg (ref 26.0–34.0)
MCHC: 32.4 g/dL (ref 30.0–36.0)
MCV: 91.5 fL (ref 80.0–100.0)
Platelets: 124 K/uL — ABNORMAL LOW (ref 150–400)
RBC: 2.7 MIL/uL — ABNORMAL LOW (ref 4.22–5.81)
RDW: 16.5 % — ABNORMAL HIGH (ref 11.5–15.5)
WBC: 6 K/uL (ref 4.0–10.5)
nRBC: 0 % (ref 0.0–0.2)

## 2024-06-07 LAB — BASIC METABOLIC PANEL WITH GFR
Anion gap: 8 (ref 5–15)
BUN: 27 mg/dL — ABNORMAL HIGH (ref 8–23)
CO2: 30 mmol/L (ref 22–32)
Calcium: 8.4 mg/dL — ABNORMAL LOW (ref 8.9–10.3)
Chloride: 101 mmol/L (ref 98–111)
Creatinine, Ser: 1.39 mg/dL — ABNORMAL HIGH (ref 0.61–1.24)
GFR, Estimated: 54 mL/min — ABNORMAL LOW (ref >60)
Glucose, Bld: 113 mg/dL — ABNORMAL HIGH (ref 70–99)
Potassium: 4.1 mmol/L (ref 3.5–5.1)
Sodium: 139 mmol/L (ref 135–145)

## 2024-06-07 LAB — GLUCOSE, CAPILLARY
Glucose-Capillary: 103 mg/dL — ABNORMAL HIGH (ref 70–99)
Glucose-Capillary: 99 mg/dL (ref 70–99)
Glucose-Capillary: 116 mg/dL — ABNORMAL HIGH (ref 70–99)
Glucose-Capillary: 174 mg/dL — ABNORMAL HIGH (ref 70–99)

## 2024-06-07 MED ORDER — METOPROLOL TARTRATE 12.5 MG HALF TABLET
12.5000 mg | ORAL_TABLET | Freq: Two times a day (BID) | ORAL | Status: DC
  Administered 2024-06-07 – 2024-06-08 (×2): 12.5 mg via ORAL
  Filled 2024-06-07 (×3): qty 1

## 2024-06-07 MED ORDER — AMIODARONE HCL 200 MG PO TABS
400.0000 mg | ORAL_TABLET | Freq: Two times a day (BID) | ORAL | Status: DC
  Administered 2024-06-07 – 2024-06-08 (×3): 400 mg via ORAL
  Filled 2024-06-07 (×3): qty 2

## 2024-06-07 MED ORDER — ENOXAPARIN SODIUM 30 MG/0.3ML IJ SOSY
30.0000 mg | PREFILLED_SYRINGE | Freq: Every day | INTRAMUSCULAR | Status: DC
  Administered 2024-06-07 – 2024-06-08 (×2): 30 mg via SUBCUTANEOUS
  Filled 2024-06-07 (×2): qty 0.3

## 2024-06-07 NOTE — Progress Notes (Addendum)
 5 Days Post-Op Procedure(s) (LRB): CORONARY ARTERY BYPASS GRAFTING X 4, USING LEFT INTERNAL MAMMARY ARTERY AND ENDOSCOPIC HARVESTED RIGHT AND LEFT GREATER SAPHENOUS VEIN (N/A) ECHOCARDIOGRAM, TRANSESOPHAGEAL, INTRAOPERATIVE (N/A) Subjective: Feels better  Objective: Vital signs in last 24 hours: Temp:  [97.8 F (36.6 C)-99.3 F (37.4 C)] 98.6 F (37 C) (10/19 0420) Pulse Rate:  [85-97] 95 (10/19 0420) Cardiac Rhythm: Normal sinus rhythm;Heart block (10/18 1940) Resp:  [12-23] 18 (10/19 0420) BP: (80-127)/(55-82) 127/82 (10/19 0420) SpO2:  [91 %-100 %] 96 % (10/19 0420) Weight:  [68.9 kg] 68.9 kg (10/19 0420)  Hemodynamic parameters for last 24 hours:    Intake/Output from previous day: 10/18 0701 - 10/19 0700 In: 240 [P.O.:240] Out: 250 [Urine:250] Intake/Output this shift: No intake/output data recorded.  General appearance: alert, cooperative, and no distress Heart: regular rate and rhythm and tachy Lungs: clear Abdomen: benign Extremities: + edema Wound: incis healing well  Lab Results: Recent Labs    06/06/24 0257 06/07/24 0315  WBC 7.6 6.0  HGB 8.3* 8.0*  HCT 25.3* 24.7*  PLT 111* 124*   BMET:  Recent Labs    06/06/24 0257 06/07/24 0315  NA 136 139  K 3.6 4.1  CL 95* 101  CO2 30 30  GLUCOSE 130* 113*  BUN 35* 27*  CREATININE 1.53* 1.39*  CALCIUM  8.4* 8.4*    PT/INR:  Recent Labs    06/05/24 1903  LABPROT 14.7  INR 1.1   ABG    Component Value Date/Time   PHART 7.313 (L) 06/03/2024 0006   HCO3 17.0 (L) 06/03/2024 0006   TCO2 18 (L) 06/03/2024 0006   ACIDBASEDEF 8.0 (H) 06/03/2024 0006   O2SAT 57.8 06/04/2024 0723   CBG (last 3)  Recent Labs    06/06/24 1723 06/06/24 2136 06/07/24 0620  GLUCAP 199* 124* 103*    Meds Scheduled Meds:  acetaminophen  1,000 mg Oral Q6H   aspirin  EC  81 mg Oral Daily   Chlorhexidine Gluconate Cloth  6 each Topical Daily   ezetimibe  10 mg Oral Q1500   fentaNYL (SUBLIMAZE) injection  25 mcg  Intravenous Once   furosemide  40 mg Oral Daily   gabapentin  100 mg Oral BID   insulin aspart  0-15 Units Subcutaneous TID WC   insulin aspart  0-5 Units Subcutaneous QHS   lidocaine  1 patch Transdermal Q24H   pantoprazole  40 mg Oral Daily   polyethylene glycol  17 g Oral BID   potassium chloride  40 mEq Oral Daily   rosuvastatin   20 mg Oral QHS   senna-docusate  2 tablet Oral QHS   sodium chloride flush  3 mL Intravenous Q12H   tamsulosin  0.4 mg Oral Q72H   Continuous Infusions:  sodium chloride     albumin human 999 mL/hr at 06/05/24 1200   PRN Meds:.sodium chloride, albumin human, albuterol, hydrOXYzine, metoprolol  tartrate, morphine injection, ondansetron (ZOFRAN) IV, mouth rinse, sodium chloride flush, traMADol  Xrays DG CHEST PORT 1 VIEW Result Date: 06/06/2024 CLINICAL DATA:  Pneumothorax. EXAM: PORTABLE CHEST 1 VIEW COMPARISON:  06/05/2024 FINDINGS: Low volume film. The cardio pericardial silhouette is enlarged. Similar left base collapse/consolidation. Improved aeration at the right base with decreasing atelectasis. Right IJ central line has been removed in the interval. Slight increase in vascular congestion without overt airspace pulmonary edema. Telemetry leads overlie the chest. IMPRESSION: 1. No discernible pneumothorax on the current study. 2. Low volume film with persistent left base collapse/consolidation. 3. Improved aeration at the  right base with decreasing atelectasis. 4. Slight increase in vascular congestion without overt airspace pulmonary edema. Electronically Signed   By: Camellia Candle M.D.   On: 06/06/2024 06:57   CT HEAD WO CONTRAST ( ) Result Date: 06/05/2024 EXAM: CT HEAD WITHOUT CONTRAST 06/05/2024 06:48:51 PM TECHNIQUE: CT of the head was performed without the administration of intravenous contrast. Automated exposure control, iterative reconstruction, and/or weight based adjustment of the mA/kV was utilized to reduce the radiation dose to as low as  reasonably achievable. COMPARISON: Comparison to prior CT from 08/11/2020. CLINICAL HISTORY: Head trauma, minor (Age >= 65y); s/p fall. pt thrombucytopenic. FINDINGS: BRAIN AND VENTRICLES: No acute hemorrhage. No evidence of acute infarct. Patchy hypodensity involving the supratentorial cerebral white matter, consistent with chronic cervical ischemic disease. No hydrocephalus. No extra-axial collection. No mass effect or midline shift. ORBITS: No acute abnormality. SINUSES: No acute abnormality. SOFT TISSUES AND SKULL: No acute soft tissue abnormality. No skull fracture. Calcified atherosclerosis present about the skull base. IMPRESSION: 1. No acute intracranial abnormality. Electronically signed by: Morene Hoard MD 06/05/2024 07:29 PM EDT RP Workstation: [id number]   DG CHEST PORT 1 VIEW Result Date: 06/05/2024 CLINICAL DATA:  Pneumothorax. EXAM: PORTABLE CHEST 1 VIEW COMPARISON:  Radiograph yesterday FINDINGS: Previous left apical pneumothorax is not seen on the current exam. Lucency in the medial aspect the left hemithorax may represent a small medial component, although not well demonstrated on supine exam. Left-sided chest tube may remain in place but is not well visualized on the current exam. Right internal jugular central line remains in place. Bibasilar opacities, left greater than right, with associated pleural effusions, unchanged from yesterday. Stable heart size and mediastinal contours. IMPRESSION: 1. Previous left apical pneumothorax is not seen on the current exam. Lucency in the medial aspect of the left hemithorax may represent a small medial component, although not well demonstrated on supine exam. 2. Bibasilar opacities, left greater than right, with associated pleural effusions, unchanged from yesterday. Electronically Signed   By: Andrea Gasman M.D.   On: 06/05/2024 15:20    Assessment/Plan: S/P Procedure(s) (LRB): CORONARY ARTERY BYPASS GRAFTING X 4, USING LEFT INTERNAL  MAMMARY ARTERY AND ENDOSCOPIC HARVESTED RIGHT AND LEFT GREATER SAPHENOUS VEIN (N/A) ECHOCARDIOGRAM, TRANSESOPHAGEAL, INTRAOPERATIVE (N/A) POD#5 CABG  1 Tmax 99.3, VSS, had some afib/SVT , freq PVC's including trigeminy, will start po amio, po metoprolol  2 O2 sats good on RA 3 voiding- not measured, weight =preop 4 BS adeq controlled- not a diabetic 5 AKI- creat conts to trend lower as does BUN, will diurese a little further w/ edema, and some vasc congestion on CXR yesterday 6 HGB stable, thrombocytopenia improved trend 7 push rehab and pulm hygiene 8 will add lovenox for DVT ppx       LOS: 5 days    Lemond FORBES Cera PA-C Pager [phone] 06/07/2024  Agree Dispo planning  Aseel Uhde MALVA Rayas

## 2024-06-07 NOTE — Progress Notes (Signed)
 Pt ambulated 470 feet around unit with front wheel walker. After returning to his room the patient had about 10 second run of SVT but quickly returned ST 102.

## 2024-06-08 ENCOUNTER — Other Ambulatory Visit: Payer: Self-pay

## 2024-06-08 ENCOUNTER — Other Ambulatory Visit (HOSPITAL_COMMUNITY): Payer: Self-pay

## 2024-06-08 ENCOUNTER — Inpatient Hospital Stay (HOSPITAL_COMMUNITY)

## 2024-06-08 DIAGNOSIS — Z951 Presence of aortocoronary bypass graft: Secondary | ICD-10-CM

## 2024-06-08 DIAGNOSIS — I25118 Atherosclerotic heart disease of native coronary artery with other forms of angina pectoris: Secondary | ICD-10-CM

## 2024-06-08 LAB — CBC
HCT: 23.7 % — ABNORMAL LOW (ref 39.0–52.0)
Hemoglobin: 7.9 g/dL — ABNORMAL LOW (ref 13.0–17.0)
MCH: 30.6 pg (ref 26.0–34.0)
MCHC: 33.3 g/dL (ref 30.0–36.0)
MCV: 91.9 fL (ref 80.0–100.0)
Platelets: 123 K/uL — ABNORMAL LOW (ref 150–400)
RBC: 2.58 MIL/uL — ABNORMAL LOW (ref 4.22–5.81)
RDW: 16.7 % — ABNORMAL HIGH (ref 11.5–15.5)
WBC: 6.5 K/uL (ref 4.0–10.5)
nRBC: 0 % (ref 0.0–0.2)

## 2024-06-08 LAB — GLUCOSE, CAPILLARY: Glucose-Capillary: 112 mg/dL — ABNORMAL HIGH (ref 70–99)

## 2024-06-08 LAB — BASIC METABOLIC PANEL WITH GFR
Anion gap: 7 (ref 5–15)
BUN: 25 mg/dL — ABNORMAL HIGH (ref 8–23)
CO2: 28 mmol/L (ref 22–32)
Calcium: 8.2 mg/dL — ABNORMAL LOW (ref 8.9–10.3)
Chloride: 104 mmol/L (ref 98–111)
Creatinine, Ser: 1.41 mg/dL — ABNORMAL HIGH (ref 0.61–1.24)
GFR, Estimated: 53 mL/min — ABNORMAL LOW (ref >60)
Glucose, Bld: 112 mg/dL — ABNORMAL HIGH (ref 70–99)
Potassium: 4.1 mmol/L (ref 3.5–5.1)
Sodium: 139 mmol/L (ref 135–145)

## 2024-06-08 MED ORDER — POTASSIUM CHLORIDE CRYS ER 20 MEQ PO TBCR
20.0000 meq | EXTENDED_RELEASE_TABLET | Freq: Every day | ORAL | 0 refills | Status: DC
  Filled 2024-06-08: qty 15, 15d supply, fill #0

## 2024-06-08 MED ORDER — FUROSEMIDE 20 MG PO TABS
40.0000 mg | ORAL_TABLET | Freq: Every morning | ORAL | 0 refills | Status: DC
  Filled 2024-06-08: qty 15, 7d supply, fill #0

## 2024-06-08 MED ORDER — METOPROLOL SUCCINATE ER 25 MG PO TB24
25.0000 mg | ORAL_TABLET | Freq: Every day | ORAL | 11 refills | Status: AC
  Filled 2024-06-08: qty 30, 30d supply, fill #0

## 2024-06-08 MED ORDER — GABAPENTIN 100 MG PO CAPS
100.0000 mg | ORAL_CAPSULE | Freq: Two times a day (BID) | ORAL | 1 refills | Status: DC
  Filled 2024-06-08: qty 30, 15d supply, fill #0

## 2024-06-08 MED ORDER — TRAMADOL HCL 50 MG PO TABS
50.0000 mg | ORAL_TABLET | Freq: Four times a day (QID) | ORAL | 0 refills | Status: AC | PRN
  Filled 2024-06-08: qty 28, 7d supply, fill #0

## 2024-06-08 NOTE — Progress Notes (Signed)
-  CARDIAC REHAB PHASE I   Post [facility] education including site care, restrictions, heart healthy diabetic diet, sternal precautions, IS use at home, home needs at discharge, exercise guidelines, smoking cessation and CRP2 reviewed. All questions and concerns addressed. Will refer Hepler to  for CRP2.  [phone]   Dustin JAYSON [place], RN BSN 06/08/2024 10:17 AM

## 2024-06-08 NOTE — Progress Notes (Signed)
 Mobility Specialist Progress Note:   06/08/24 1020  Mobility  Activity Ambulated with assistance  Level of Assistance Contact guard assist, steadying assist  Assistive Device None  Distance Ambulated (ft) 20 ft  RUE Weight Bearing Per Provider Order NWB  LUE Weight Bearing Per Provider Order NWB  Activity Response Tolerated well  Mobility Referral Yes  Mobility visit 1 Mobility  Mobility Specialist Start Time (ACUTE ONLY) 1020  Mobility Specialist Stop Time (ACUTE ONLY) 1025  Mobility Specialist Time Calculation (min) (ACUTE ONLY) 5 min   Pt received in chair, agreeable to mobility session. Ambulated to bathroom with CGA, no AD required. Asx throughout, no LOB. Wife in room. Left with all needs met.    Mackenzye Mackel Mobility Specialist Please contact via Special educational needs teacher or  Rehab office at [phone]

## 2024-06-08 NOTE — Progress Notes (Addendum)
 AVS and discharge education,  PIV removed gauze dressing intact. Tele removed. And placed at nurse station.    Patent and family given time to ask questions.  Patient and family verbalized understanding of discharge instruction and plan of care.    RW to be delivered to [facility] by [facility].  TOC meds ready.  Discharge lounge called.

## 2024-06-08 NOTE — Progress Notes (Signed)
 06/08/2024 11:58 AM -----------------------------------------------------------[facility]--------------------------------------------------- D(Data) A(Action) R(response)     Data: Discharge Readiness Assessment EDD today 06/08/2024    Action: Chart reviewed    Response: No immediate Barriers to discharge identified at this time. RW to be delivered to DC Lounge per ICM note.     Yeny Schmoll, RN [employer]

## 2024-06-08 NOTE — Care Management Important Message (Signed)
 Important Message  Patient Details  Name: Dustin Sloan MRN: [id number] Date of Birth: 09-12-1950   Important Message Given:  Yes - Medicare IM     Vonzell Arrie Sharps 06/08/2024, 10:53 AM

## 2024-06-08 NOTE — Progress Notes (Signed)
   [address] [place] [address]             [phone]  6 Days Post-Op Procedure(s) (LRB): CORONARY ARTERY BYPASS GRAFTING X 4, USING LEFT INTERNAL MAMMARY ARTERY AND ENDOSCOPIC HARVESTED RIGHT AND LEFT GREATER SAPHENOUS VEIN (N/A) ECHOCARDIOGRAM, TRANSESOPHAGEAL, INTRAOPERATIVE (N/A)  Subjective: No new concerns, eager to go home today.   Objective: Vital signs in last 24 hours: Temp:  [97.7 F (36.5 C)-98.8 F (37.1 C)] 98.2 F (36.8 C) (10/20 0742) Pulse Rate:  [78-92] 79 (10/20 0742) Cardiac Rhythm: Normal sinus rhythm (10/19 1932) Resp:  [16-20] 20 (10/20 0742) BP: (102-131)/(66-82) 115/77 (10/20 0742) SpO2:  [92 %-98 %] 98 % (10/20 0742) Weight:  [67.4 kg] 67.4 kg (10/20 0358)    Intake/Output from previous day: 10/19 0701 - 10/20 0700 In: 360 [P.O.:360] Out: 2300 [Urine:2300] Intake/Output this shift: Total I/O In: -  Out: 400 [Urine:400]  General appearance: alert, cooperative, and no distress Neurologic: intact Heart: RRR. Monitor showing NSR with few PVC's and PAC's Lungs: respirations unlabored, breath sounds clear. Stable sats on RA.  Abdomen: soft, no tenderness.  Extremities: no peripheral edema, has expected bruising at bilateral LE vein harvest incisions. No palpable hematomas.  Wound: The sternotomy incision and chest tube sites are intact and dry.   Lab Results: Recent Labs    06/07/24 0315 06/08/24 0315  WBC 6.0 6.5  HGB 8.0* 7.9*  HCT 24.7* 23.7*  PLT 124* 123*   BMET:  Recent Labs    06/07/24 0315 06/08/24 0315  NA 139 139  K 4.1 4.1  CL 101 104  CO2 30 28  GLUCOSE 113* 112*  BUN 27* 25*  CREATININE 1.39* 1.41*  CALCIUM  8.4* 8.2*    PT/INR:  Recent Labs    06/05/24 1903  LABPROT 14.7  INR 1.1   ABG    Component Value Date/Time   PHART 7.313 (L) 06/03/2024 0006   HCO3 17.0 (L) 06/03/2024 0006   TCO2 18 (L) 06/03/2024 0006   ACIDBASEDEF 8.0 (H) 06/03/2024 0006   O2SAT 57.8 06/04/2024 0723    CBG (last 3)  Recent Labs    06/07/24 1642 06/07/24 2048 06/08/24 0640  GLUCAP 116* 174* 112*    Assessment/Plan: S/P Procedure(s) (LRB): CORONARY ARTERY BYPASS GRAFTING X 4, USING LEFT INTERNAL MAMMARY ARTERY AND ENDOSCOPIC HARVESTED RIGHT AND LEFT GREATER SAPHENOUS VEIN (N/A) ECHOCARDIOGRAM, TRANSESOPHAGEAL, INTRAOPERATIVE (N/A)  -POD6 CABG x 4. Stable VS and cardiac rhythm. On ASA, statin, BB. No further SVT so will stop the amiodarone.  Now independent with mobility. Plan discharge to home today.   -PULM- on RA with good oxygenation.  Advised to continue using the incentive spirometer at home for next week.   -GI- tolerating cardiac diet with no difficulty. Having daily BM's.  -ENDO- H/O pre-diabetes but HgbA1C was 5.5 on admission.   -Renal: mild AKI post-op, creat trending down. Will continue Lasix at discharge until office follow up.  Re-check BMP at follow up visit on 10/30.   -Disposition: discharge to home today. Instructions given.      LOS: 6 days    Ikram Riebe G. Akua Blethen, PA-C 06/08/2024

## 2024-06-08 NOTE — TOC Transition Note (Signed)
 Transition of Care (TOC) - Discharge Note Rayfield Gobble RN, BSN Inpatient Care Management Unit 4E- RN Case Manager See Treatment Team for direct phone #   Patient Details  Name: Dustin Sloan MRN: [id number] Date of Birth: 19-Aug-1951  [facility]) CM/SW Contact:  Gobble Rayfield Hurst, RN Phone Number: 06/08/2024, 11:47 AM   Clinical Narrative:    Pt stable for transition home today, per cardiac rehab pt will need RW for home, DME order has been placed.   Pt has [facility] HMO plan which contracts with [employer] for DME needs- [employer] liaison contacted for DME referral- referral to be expedited for discharge- [employer] will deliver RW to discharge lounge prior to pt leaving hospital.   Wife to transport home  IP CM interventions have been completed no further needs noted.   Final next level of care: Home/Self Care Barriers to Discharge: No Barriers Identified   Patient Goals and CMS Choice Patient states their goals for this hospitalization and ongoing recovery are:: return home   Choice offered to / list presented to : Patient (pt has [facility] HMO- contracts with [employer])      Discharge Placement                 Home      Discharge Plan and Services Additional resources added to the After Visit Summary for     Discharge Planning Services: CM Consult Post Acute Care Choice: Durable Medical Equipment          DME Arranged: Vannie rolling DME Agency: [employer] Date DME Agency Contacted: 06/08/24 Time DME Agency Contacted: 1146 Representative spoke with at DME Agency: Zack HH Arranged: NA HH Agency: NA        Social Drivers of Health (SDOH) Interventions SDOH Screenings   Food Insecurity: No Food Insecurity (06/03/2024)  Housing: Low Risk  (06/03/2024)  Transportation Needs: No Transportation Needs (06/03/2024)  Utilities: Not At Risk (06/03/2024)  Social Connections: Socially Integrated (06/03/2024)  Tobacco Use: Low Risk  (06/02/2024)      Readmission Risk Interventions    06/08/2024   11:47 AM  Readmission Risk Prevention Plan  Post Dischage Appt Complete  Medication Screening Complete  Transportation Screening Complete

## 2024-06-10 ENCOUNTER — Telehealth (HOSPITAL_COMMUNITY): Payer: Self-pay

## 2024-06-10 NOTE — Telephone Encounter (Signed)
Per phase I cardiac rehab, fax referral to [place].

## 2024-06-12 ENCOUNTER — Other Ambulatory Visit: Payer: Self-pay

## 2024-06-12 DIAGNOSIS — I25118 Atherosclerotic heart disease of native coronary artery with other forms of angina pectoris: Secondary | ICD-10-CM

## 2024-06-17 ENCOUNTER — Other Ambulatory Visit: Payer: Self-pay

## 2024-06-18 ENCOUNTER — Ambulatory Visit (HOSPITAL_COMMUNITY)
Admission: RE | Admit: 2024-06-18 | Discharge: 2024-06-18 | Disposition: A | Discharge status: Home / Self Care | Source: Ambulatory Visit | Attending: Internal Medicine | Admitting: Internal Medicine

## 2024-06-18 ENCOUNTER — Inpatient Hospital Stay: Payer: Self-pay

## 2024-06-18 VITALS — BP 139/82 | HR 91 | Resp 20 | Wt 145.5 lb

## 2024-06-18 DIAGNOSIS — I25118 Atherosclerotic heart disease of native coronary artery with other forms of angina pectoris: Secondary | ICD-10-CM | POA: Insufficient documentation

## 2024-06-18 DIAGNOSIS — J90 Pleural effusion, not elsewhere classified: Secondary | ICD-10-CM | POA: Diagnosis not present

## 2024-06-18 DIAGNOSIS — R918 Other nonspecific abnormal finding of lung field: Secondary | ICD-10-CM | POA: Diagnosis not present

## 2024-06-18 MED ORDER — FUROSEMIDE 20 MG PO TABS: 20.0000 mg | ORAL_TABLET | Freq: Every day | ORAL | 2 refills | Status: AC

## 2024-06-18 MED ORDER — POTASSIUM CHLORIDE CRYS ER 20 MEQ PO TBCR: 20.0000 meq | EXTENDED_RELEASE_TABLET | Freq: Two times a day (BID) | ORAL | 1 refills | Status: DC

## 2024-06-18 NOTE — Patient Instructions (Addendum)
 Take stool softeners (eg: Colace, miralax) daily to avoid constipation.  Take TRAMADOL as needed for pain.

## 2024-06-18 NOTE — Progress Notes (Deleted)
 [address] [place] [address]             [phone]    Theron Cumbie [facility] Medical Record #[id number] Date of Birth: 22-Apr-1951  Referring: Ladona Heinz, MD Primary Care: Duwaine Burnard Amble, NP Primary Cardiologist:None  Chief Complaint:    Chief Complaint  Patient presents with   Routine Post Op    S/p CABG 10/14 with chest xray    History of Present Illness:     Dustin Sloan is a 73 y.o. male who presents for surgical evaluation of multivessel coronary artery disease.  The patient is very anxious and has reports progressive fatigue and shortness of breath that has worsened.  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  He reports chest pain that feels like a tremble but that it comes and goes.  He has never had a stroke but has a son who has had 2 strokes.  He reports orthopnea (although likely anxiety related) and sometimes gets dizzy.  He has also had a lot of kidney stones in the past.  He is a never smoker, does not use alcohol.  Reports that he has been difficult to put to sleep in the past and was awake for arm surgery and colonoscopy.  LHC (05/20/24): LAD, OM2 and PDA disease. TTE (05/26/24): LVEF 60-65%, normal RV function. No AI, no AS; no MR, no MS CT chest (04/29/24): No root or ascending aortic calcium   Past Medical and Surgical History: Previous Chest Surgery: No Previous Chest Radiation: No Diabetes Mellitus: No.  HbA1C N/A Anticoagulation: No, Last dose N/A  Creatinine:  Lab Results  Component Value Date   CREATININE 1.41 (H) 06/08/2024   CREATININE 1.39 (H) 06/07/2024   CREATININE 1.53 (H) 06/06/2024     Past Medical History:  Diagnosis Date   Abnormal stress test 04/15/2024   Abnormal urine 07/22/2023   Unspecified abnormal findings in urine     Allergies    Arthritis    Asymptomatic varicose veins of both lower extremities 08/23/2020   Benign prostatic hyperplasia     Blood in urine 08/08/2021   Hematuria, unspecifiedHematuria, unspecified; Start Date : 10/24/2022Hematuria, unspecified; Start Date : 03/14/2021     Body mass index (BMI) 24.0-24.9, adult 12/27/2021   Body mass index [BMI] 24.0-24.9, adultBody mass index [BMI] 24.0-24.9, adult; Start Date : 02/07/2023Body mass index [BMI] 24.0-24.9, adult; Start Date : 12/20/2022Body mass index [BMI] 24.0-24.9, adult; Start Date : 06/12/2021     Body mass index (BMI) 27.0-27.9, adult 02/24/2019   Body mass index [BMI] 27.0-27.9, adult     CAD (coronary artery disease) 05/07/2024   Capsulitis of metatarsophalangeal (MTP) joint of right foot 04/14/2020   Chest pain 08/23/2020   Disequilibrium 09/26/2021   Dizziness and giddinessDizziness and giddiness; Start Date : 04/22/2022Dizziness and giddiness; Start Date : 01/26/2022Dizziness and giddiness; Start Date : 12/17/2021Dizziness and giddiness; Start Date : 07/05/2020     Dizziness    Electrocardiogram abnormal 03/29/2022   Abnormal electrocardiogram [ECG] [EKG]     Encounter for examination of eyes and vision without abnormal findings 03/29/2022   Encounter for exam of eyes and vision w/o abnormal findingsEncounter for exam of eyes and vision w/o abnormal findings; Start Date : 07/26/2022Encounter for exam of eyes and vision w/o abnormal findings; Start Date : 04/29/2021Encounter for exam of eyes and vision w/o abnormal findings; Start Date : 02/24/2019  Encounter for screening for cardiovascular disorders 07/22/2023   Encounter for screening for cardiovascular disordersEncounter for screening for cardiovascular disorders; Start Date : 03/29/2022     Essential hypertension    Fatigue    History of kidney stones    Hyperlipidemia    Hypertension    Impaired cognition    Inflammatory dermatosis    Kidney stone    Low testosterone    Other specified health status 04/11/2023   Other specified health status     Pain in left foot 12/17/2019    Pain in left foot     Pain in right foot 12/17/2019   Pain in right foot     Pre-diabetes    Shortness of breath    Sleep apnea    does not use CPAP   Spasm 07/09/2022   Cramp and spasmCramp and spasm; Start Date : 02/24/2019     Testicular hypofunction    Tight heel cords, acquired, bilateral 04/15/2020   Unstable angina pectoris (HCC) 08/23/2020   Vitamin D deficiency     Past Surgical History:  Procedure Laterality Date   APPENDECTOMY     2018   CATARACT EXTRACTION W/ INTRAOCULAR LENS IMPLANT Right 06/18/2023   COLONOSCOPY     CORONARY ARTERY BYPASS GRAFT N/A 06/02/2024   Procedure: CORONARY ARTERY BYPASS GRAFTING X 4, USING LEFT INTERNAL MAMMARY ARTERY AND ENDOSCOPIC HARVESTED RIGHT AND LEFT GREATER SAPHENOUS VEIN;  Surgeon: Daniel Con RAMAN, MD;  Location: [facility] OR;  Service: Open Heart Surgery;  Laterality: N/A;   EYE SURGERY     bilateral cataracts removed   HERNIA REPAIR     INTRAOPERATIVE TRANSESOPHAGEAL ECHOCARDIOGRAM N/A 06/02/2024   Procedure: ECHOCARDIOGRAM, TRANSESOPHAGEAL, INTRAOPERATIVE;  Surgeon: Daniel Con RAMAN, MD;  Location: [facility] OR;  Service: Open Heart Surgery;  Laterality: N/A;   LITHOTRIPSY     x several   NECK SURGERY     1988, 2008   RIGHT/LEFT HEART CATH AND CORONARY ANGIOGRAPHY N/A 05/20/2024   Procedure: RIGHT/LEFT HEART CATH AND CORONARY ANGIOGRAPHY;  Surgeon: Ladona Heinz, MD;  Location: [facility] INVASIVE CV LAB;  Service: Cardiovascular;  Laterality: N/A;    Social History:  Social History   Tobacco Use  Smoking Status Never  Smokeless Tobacco Never    Social History   Substance and Sexual Activity  Alcohol Use Never     Allergies  Allergen Reactions   Lipitor [Atorvastatin  Calcium ] Other (See Comments)    Myalgia    Oxycodone-Acetaminophen Nausea And Vomiting    Medications: Asprin: Yes Statin: Yes Beta Blocker: No Ace Inhibitor/ARB: Hyzaar Anti-Coagulation: No  Current Outpatient Medications  Medication Sig Dispense Refill    albuterol (VENTOLIN HFA) 108 (90 Base) MCG/ACT inhaler Inhale 2 puffs into the lungs every 6 (six) hours as needed for wheezing or shortness of breath.     aspirin  EC 81 MG tablet Take 1 tablet (81 mg total) by mouth daily. Swallow whole. 90 tablet 3   Cholecalciferol (VITAMIN D-3) 125 MCG (5000 UT) TABS Take 5,000 Units by mouth in the morning.     ezetimibe (ZETIA) 10 MG tablet Take 10 mg by mouth daily in the afternoon.     furosemide (LASIX) 20 MG tablet Take 2 tablets (40 mg total) by mouth in the morning. For 5 days then decrease the dose to 1 tablet daily. 15 tablet 0   gabapentin (NEURONTIN) 100 MG capsule Take 1 capsule (100 mg total) by mouth 2 (two) times daily. 30 capsule 1   Garlic 300  MG CAPS Take 300 mg by mouth every evening.     hydrOXYzine (ATARAX) 25 MG tablet Take 25 mg by mouth every other day as needed (sleep).     meclizine (ANTIVERT) 25 MG tablet Take 25 mg by mouth 3 (three) times daily as needed for dizziness.     metoprolol  succinate (TOPROL  XL) 25 MG 24 hr tablet Take 1 tablet (25 mg total) by mouth daily. 30 tablet 11   minocycline (MINOCIN) 100 MG capsule Take 100 mg by mouth daily as needed (skin blemishes).     Multiple Vitamin (MULTIVITAMIN) tablet Take 1 tablet by mouth in the morning.     potassium chloride SA (KLOR-CON M) 20 MEQ tablet Take 1 tablet (20 mEq total) by mouth daily. 15 tablet 0   rosuvastatin  (CRESTOR ) 20 MG tablet Take 1 tablet (20 mg total) by mouth daily. (Patient taking differently: Take 20 mg by mouth at bedtime.) 30 tablet 2   tamsulosin (FLOMAX) 0.4 MG CAPS capsule Take 0.4 mg by mouth every 3 (three) days. Patient takes this med at night     triamcinolone (KENALOG) 0.1 % Apply 1 application  topically 2 (two) times daily as needed (skin rash/irritation.).     No current facility-administered medications for this visit.    (Not in a hospital admission)   Family History  Problem Relation Age of Onset   Diabetes Mother    Kidney disease  Mother    Kidney Stones Mother    Kidney Stones Sister      Review of Systems:   Review of Systems  Constitutional:  Positive for malaise/fatigue. Negative for chills and fever.  Respiratory:  Positive for shortness of breath. Negative for cough.   Cardiovascular:  Positive for palpitations and orthopnea. Negative for chest pain and leg swelling.  Gastrointestinal:  Negative for nausea and vomiting.  Neurological:  Positive for dizziness. Negative for focal weakness.      Physical Exam: BP 139/82 (BP Location: Right Arm, Patient Position: Sitting, Cuff Size: Normal)   Pulse 91   Resp 20   Wt 145 lb 8 oz (66 kg)   SpO2 98% Comment: RA  BMI 24.21 kg/m  Physical Exam Constitutional:      General: He is not in acute distress.    Appearance: Normal appearance.  HENT:     Head: Normocephalic and atraumatic.  Cardiovascular:     Rate and Rhythm: Normal rate and regular rhythm.  Pulmonary:     Effort: Pulmonary effort is normal.     Breath sounds: Normal breath sounds.  Abdominal:     General: There is no distension.     Palpations: Abdomen is soft.  Musculoskeletal:     Comments: Mild leg edema. Small varicose vein in upper right calf.  Left leg without varicose vein  Skin:    General: Skin is warm and dry.  Neurological:     General: No focal deficit present.     Mental Status: He is alert and oriented to person, place, and time.      Diagnostic Studies & Laboratory data: Cardiac Studies & Procedures   ______________________________________________________________________________________________ CARDIAC CATHETERIZATION  CARDIAC CATHETERIZATION 05/20/2024  Conclusion Images from the original result were not included. Cardiac Catheterization 05/20/24: Hemodynamic data: Right heart catheterization revealing normal cardiac output and index without evidence of intracardiac shunting.  PAPi was reduced at 0.9 however probably an error. LVEDP 17 mmHg, mildly  elevated. Angiographic data: LV: Normal LV systolic function, EF 60%.  Normal size.  No significant mitral regurgitation. LM: It is calcified but widely patent. LAD: It gives origin to a very large D1.  There is a long segment calcific proximal to mid 80% stenosis.  D1 which is large has ostial 40% stenosis.  Distal LAD and D1 have minimal disease. LCx: Ostium has a 50% stenosis.  There is a high OM1 which is very small measuring about 1.5 mm however supplies large area with mild ostial 60 to 70% stenosis.  Large OM 2 with a mid 80% stenosis.  Moderate-sized OM 3. RCA: Mid and distal RCA are ectatic.  Gives origin to large PL branch with minimal disease and a large PDA with ostial 80% stenosis.    Impression and recommendations: Normal LVEF. Severe and complex proximal LAD stenosis involving a very large D1 which are good targets for CABG, high-grade large OM 2 stenosis and the PDA stenosis which are also good targets.  Patient would benefit from CABG.  Surgical referral was made.  Findings Coronary Findings Diagnostic  Dominance: Right  Left Anterior Descending Ost LAD to Mid LAD lesion is 80% stenosed.  First Diagonal Branch 1st Diag lesion is 40% stenosed.  Left Circumflex Ost Cx to Prox Cx lesion is 50% stenosed.  First Obtuse Marginal Branch Vessel is small in size.  Second Obtuse Marginal Branch 2nd Mrg lesion is 80% stenosed.  Third Obtuse Marginal Branch 3rd Mrg lesion is 20% stenosed.  Right Coronary Artery Prox RCA lesion is 20% stenosed. Non-stenotic Mid RCA to Dist RCA lesion.  Right Posterior Descending Artery RPDA lesion is 80% stenosed.  Intervention  No interventions have been documented.   STRESS TESTS  MYOCARDIAL PERFUSION IMAGING 02/19/2024  Interpretation Summary   The study is normal. The study is low risk.   2.5 mm of horizontal ST depression in the inferolateral leads (II, III, aVF, V5 and V6) was noted.   Left ventricular function is normal.  Nuclear stress EF: 69%. The left ventricular ejection fraction is hyperdynamic (>65%). End diastolic cavity size is normal.   Please note abnormal EKG, but no chest pain, normal nuclear assessment.   ECHOCARDIOGRAM  ECHOCARDIOGRAM COMPLETE 05/26/2024  Narrative ECHOCARDIOGRAM REPORT    Patient Name:   Dustin Sloan Date of Exam: 05/26/2024 Medical Rec #:  [id number]     Height:       65.0 in Accession #:    [id number]    Weight:       150.0 lb Date of Birth:  10-16-1950     BSA:          1.750 m Patient Age:    73 years      BP:           119/82 mmHg Patient Gender: M             HR:           77 bpm. Exam Location:  [place]  Procedure: 2D Echo, 3D Echo, Cardiac Doppler, Color Doppler and Strain Analysis (Both Spectral and Color Flow Doppler were utilized during procedure).  Indications:    Coronary artery disease of native artery of native heart with stable angina pectoris [I25.118]  History:        Patient has prior history of Echocardiogram examinations, most recent 07/08/2020. CAD, Signs/Symptoms:Shortness of Breath; Risk Factors:Hypertension and Dyslipidemia.  Sonographer:    Charlie Jointer RDCS Referring Phys: [id number] ALEAN SAUNDERS MADIREDDY  IMPRESSIONS   1. Left ventricular ejection fraction, by estimation, is 60 to 65%. The left ventricle has  normal function. The left ventricle has no regional wall motion abnormalities. Left ventricular diastolic parameters are consistent with Grade I diastolic dysfunction (impaired relaxation). The average left ventricular global longitudinal strain is -18.2 %. The global longitudinal strain is normal. 2. Right ventricular systolic function is normal. The right ventricular size is normal. There is normal pulmonary artery systolic pressure. 3. The mitral valve is normal in structure. No evidence of mitral valve regurgitation. No evidence of mitral stenosis. 4. The aortic valve is normal in structure. Aortic valve regurgitation is not  visualized. No aortic stenosis is present. 5. The inferior vena cava is normal in size with greater than 50% respiratory variability, suggesting right atrial pressure of 3 mmHg.  FINDINGS Left Ventricle: Left ventricular ejection fraction, by estimation, is 60 to 65%. The left ventricle has normal function. The left ventricle has no regional wall motion abnormalities. The average left ventricular global longitudinal strain is -18.2 %. Strain was performed and the global longitudinal strain is normal. The left ventricular internal cavity size was normal in size. There is no left ventricular hypertrophy. Left ventricular diastolic parameters are consistent with Grade I diastolic dysfunction (impaired relaxation).  Right Ventricle: The right ventricular size is normal. No increase in right ventricular wall thickness. Right ventricular systolic function is normal. There is normal pulmonary artery systolic pressure. The tricuspid regurgitant velocity is 2.10 m/s, and with an assumed right atrial pressure of 3 mmHg, the estimated right ventricular systolic pressure is 20.6 mmHg.  Left Atrium: Left atrial size was normal in size.  Right Atrium: Right atrial size was normal in size.  Pericardium: There is no evidence of pericardial effusion.  Mitral Valve: The mitral valve is normal in structure. No evidence of mitral valve regurgitation. No evidence of mitral valve stenosis.  Tricuspid Valve: The tricuspid valve is normal in structure. Tricuspid valve regurgitation is not demonstrated. No evidence of tricuspid stenosis.  Aortic Valve: The aortic valve is normal in structure. Aortic valve regurgitation is not visualized. No aortic stenosis is present.  Pulmonic Valve: The pulmonic valve was normal in structure. Pulmonic valve regurgitation is not visualized. No evidence of pulmonic stenosis.  Aorta: The aortic root is normal in size and structure.  Venous: The inferior vena cava is normal in size  with greater than 50% respiratory variability, suggesting right atrial pressure of 3 mmHg.  IAS/Shunts: No atrial level shunt detected by color flow Doppler.   LEFT VENTRICLE PLAX 2D LVIDd:         4.10 cm   Diastology LVIDs:         2.90 cm   LV e' medial:    7.34 cm/s LV PW:         1.10 cm   LV E/e' medial:  9.5 LV IVS:        1.10 cm   LV e' lateral:   9.65 cm/s LVOT diam:     2.20 cm   LV E/e' lateral: 7.3 LV SV:         70 LV SV Index:   40        2D Longitudinal Strain LVOT Area:     3.80 cm  2D Strain GLS Avg:     -18.2 %  3D Volume EF: 3D EF:        58 % LV EDV:       106 ml LV ESV:       45 ml LV SV:        61 ml  RIGHT VENTRICLE             IVC RV Basal diam:  3.00 cm     IVC diam: 1.20 cm RV Mid diam:    2.60 cm RV S prime:     13.47 cm/s TAPSE (M-mode): 2.5 cm  LEFT ATRIUM             Index        RIGHT ATRIUM           Index LA diam:        2.70 cm 1.54 cm/m   RA Area:     19.10 cm LA Vol (A2C):   41.2 ml 23.54 ml/m  RA Volume:   52.30 ml  29.88 ml/m LA Vol (A4C):   52.2 ml 29.82 ml/m LA Biplane Vol: 49.6 ml 28.34 ml/m AORTIC VALVE LVOT Vmax:   95.37 cm/s LVOT Vmean:  62.367 cm/s LVOT VTI:    0.184 m  AORTA Ao Root diam: 3.60 cm Ao Asc diam:  3.80 cm Ao Desc diam: 2.20 cm  MITRAL VALVE               TRICUSPID VALVE MV Area (PHT): 4.26 cm    TR Peak grad:   17.6 mmHg MV Decel Time: 178 msec    TR Vmax:        210.00 cm/s MV E velocity: 69.95 cm/s MV A velocity: 87.55 cm/s  SHUNTS MV E/A ratio:  0.80        Systemic VTI:  0.18 m Systemic Diam: 2.20 cm  Jennifer Crape MD Electronically signed by Jennifer Crape MD Signature Date/Time: 05/26/2024/3:23:06 PM    Final   TEE  ECHO INTRAOPERATIVE TEE 06/02/2024  Narrative *INTRAOPERATIVE TRANSESOPHAGEAL REPORT *    Patient Name:   ANGELUS HOOPES Date of Exam: 06/02/2024 Medical Rec #:  [id number]     Height:       65.0 in Accession #:    [id number]    Weight:       150.0 lb Date of  Birth:  03/15/1951     BSA:          1.75 m Patient Age:    73 years      BP:           135/88 mmHg Patient Gender: M             HR:           56 bpm. Exam Location:  Inpatient  Transesophogeal exam was perform intraoperatively during surgical procedure. Patient was closely monitored under general anesthesia during the entirety of examination.  Indications:     I25.110 Atherosclerotic heart disease of native coronary artery with unstable angina pectoris Performing Phys: [id number] CON RAMAN Solimar Maiden Diagnosing Phys: Kelly Mace MD  Complications: No known complications during this procedure. POST-OP IMPRESSIONS limited Post-CPB exam: The patient separated easily from CPB  _ Left Ventricle: The left ventricular function remains low normal, unchanged from pre-bypass images. There are no specific wall motion abnormalities, other than septal flattening when paced. Overall EF 52%. _ Right Ventricle: The right ventricular function appears normal, unchanged from pre-bypass. _ Aortic Valve: The aortic valve appears unchanged from pre-bypass images. There is no insufficiency. _ Mitral Valve: The mitral valve function appears normal, unchanged from pre-bypass images. There is trivial mitral regurgitation. _ Tricuspid Valve: The tricuspid valve function appears normal, unchanged from pre-bypass images. There is trivial tricuspid regurgitation.  PRE-OP FINDINGS Left Ventricle: The left ventricle has  low normal systolic function, with an ejection fraction of 50-55%, measured 50%. The cavity size was normal. Left ventrical global hypokinesis without regional wall motion abnormalities. There is no left ventricular hypertrophy. Left ventricular diastolic function was not evaluated.  Right Ventricle: The right ventricle has normal systolic function. The cavity was normal. There is no increase in right ventricular wall thickness.  Left Atrium: Left atrial size was normal in size. No left atrial/left  atrial appendage thrombus was detected. Left atrial appendage velocity is low normal, reduced at less than 40 cm/s.  Right Atrium: Right atrial size was normal in size. Catheter present in the right atrium.  Interatrial Septum: No atrial level shunt detected by color flow Doppler. There is no evidence of a patent foramen ovale.  Pericardium: There is no evidence of pericardial effusion.  Mitral Valve: The mitral valve is normal in structure. Mitral valve regurgitation is trivial by color flow Doppler. There is no evidence of mitral valve vegetation. There is no evidence of mitral stenosis, peak gradient 2 mmHg, mean gradient 1 mmHg.  Tricuspid Valve: The tricuspid valve was normal in structure. Tricuspid valve regurgitation is trivial by color flow Doppler. No evidence of tricuspid stenosis is present. There is no evidence of tricuspid valve vegetation.  Aortic Valve: The aortic valve is tricuspid. Aortic valve regurgitation was not visualized by color flow Doppler. There is no stenosis of the aortic valve, with peak gradient 5 mmHg, mean gradient 2 mmHg. There is no evidence of aortic valve vegetation.  Pulmonic Valve: The pulmonic valve was normal in structure, with normal leaflet excursion. Pulmonic valve regurgitation is mild by color flow Doppler..   Aorta: The aortic arch, ascending aorta and aortic root are normal in size and structure.  Pulmonary Artery: The pulmonary artery is of normal size.  Venous: The inferior vena cava was not well visualized.  Shunts: There is no evidence of an atrial septal defect.   Kelly Mace MD Electronically signed by Kelly Mace MD Signature Date/Time: 06/02/2024/5:00:38 PM    Final  MONITORS  LONG TERM MONITOR (3-14 DAYS) 09/09/2020  Narrative The patient wore the monitor for 14 days starting 08/23/2020 . Indication: Dizziness  The minimum heart rate was 52  bpm, maximum heart rate was 150  bpm, and average heart rate was    bpm. Predominant underlying rhythm was Sinus Rhythm. First Degree AV Block was present  Premature atrial complexes were rare. Premature Ventricular complexes were rare.  No ventricular tachycardia, no pauses, no AV block, no supraventricular and no atrial fibrillation present. No patient triggered event or diary event noted.  Conclusion: Normal/Unremarkable study, with no evidence of significant arrhythmia.   CT SCANS  CT CORONARY MORPH W/CTA COR W/SCORE 04/28/2024  Addendum 04/30/2024  3:27 PM ADDENDUM REPORT: 04/30/2024 15:24  EXAM: OVER-READ INTERPRETATION CT CHEST  The following report is an over-read performed by radiologist Dr. Rogelia Myers of [facility] on 04/30/2024. This over-read does not include interpretation of cardiac or coronary anatomy or pathology. The coronary CTA interpretation by the cardiologist is attached.  COMPARISON:  02/01/2024  FINDINGS: Cardiovascular: Normal appearance of extracardiac vascular structures. No pericardial effusion. No aortic aneurysm.  Mediastinum/Nodes: No mediastinal mass. No mediastinal or hilar lymphadenopathy. Normal esophagus.  Lungs/Pleura: The midline trachea and bronchi are patent. No focal airspace consolidation, pleural effusion, or pneumothorax. Posterior bibasilar dependent atelectasis.  Musculoskeletal: No acute fracture or destructive bone lesion. Thoracic DISH.  Upper Abdomen: No acute abnormality in the partially visualized upper abdomen.  IMPRESSION: No acute abnormality within the visualized chest. No pneumonia, pulmonary edema, or pleural effusion.   Electronically Signed By: Rogelia Myers M.D. On: 04/30/2024 15:24  Narrative CLINICAL DATA:  CP  EXAM: Cardiac/Coronary  CTA  TECHNIQUE: The patient was scanned on a GE Apex scanner.  FINDINGS: A 120 kV prospective scan was triggered in the descending thoracic aorta at 111 HU's. Axial non-contrast 3 mm slices were carried  out through the heart. The data set was analyzed on a dedicated work station and scored using the Agatson method. Gantry rotation speed was 250 msecs and collimation was .6 mm. No beta blockade and 0.8 mg of sl NTG was given. The 3D data set was reconstructed at 41% of the R-R cycle. Diastolic phases were analyzed on a dedicated work station using MPR, MIP and VRT modes. The patient received 80 cc of contrast.  Aorta: Normal size.  No calcifications.  No dissection.  Aortic Valve:  Trileaflet.  No calcifications.  Coronary Arteries:  Normal coronary origin.  Right dominance.  RCA is a large dominant artery that gives rise to PDA and PLA. This artery is diffusely diseased with mixed plaque in its proximal portion with moderate stenosis of 50-69%. PLB has mixed plaque with moderate stenosis of 50-69%  Left main is a large artery that gives rise to LAD and LCX arteries.  LAD is a large vessel that is diffusely diseased with numerous mixed plaques. In the proximal portion stenosis of moderate degree 50-69% is noted, in the distal portion here is soft plaque with severe stenosis of 70-99%. This artery gives rise to large D1. D1 is also diffusely diseased with numerous plaques. Mostly calcified but few soft plaques are noted as well. At least moderate stenosis of 50-69% is suspected in the proximal and mid portion of this large D1  LCX is a non-dominant artery that gives rise to one large OM1 branch. This artery is diffusely diseased with numerous mixed plaques. Moderate stenosis 50-69% is suspected at different levels in both CX and OM1.  Other findings:  Normal pulmonary vein drainage into the left atrium.  Normal left atrial appendage without a thrombus.  Normal size of the pulmonary artery.  IMPRESSION: 1. Coronary calcium  score of 3227. This was 9 percentile for age and sex matched control.  2. Normal coronary origin with right dominance.  3. CAD-RADS 4 Severe  stenosis. (70-99%) distal LAD. Numerous moderate stenoses noted. Cardiac catheterization or CT FFR is recommended. Consider symptom-guided anti-ischemic pharmacotherapy as well as risk factor modification per guideline directed care.  Electronically Signed: By: Lamar Fitch M.D. On: 04/29/2024 17:24     ______________________________________________________________________________________________     EKG: NSR with 1st degree AV block  I have independently reviewed the above radiologic studies and discussed with the patient   Recent Lab Findings: Lab Results  Component Value Date   WBC 6.5 06/08/2024   HGB 7.9 (L) 06/08/2024   HCT 23.7 (L) 06/08/2024   PLT 123 (L) 06/08/2024   GLUCOSE 112 (H) 06/08/2024   CHOL 54 06/04/2024   TRIG 92 06/04/2024   HDL 20 (L) 06/04/2024   LDLCALC 16 06/04/2024   ALT 26 06/01/2024   AST 26 06/01/2024   NA 139 06/08/2024   K 4.1 06/08/2024   CL 104 06/08/2024   CREATININE 1.41 (H) 06/08/2024   BUN 25 (H) 06/08/2024   CO2 28 06/08/2024   TSH 3.200 08/23/2020   INR 1.1 06/05/2024   HGBA1C 5.5 06/01/2024  Assessment / Plan:   Mr. Bobier is a 73 year old man with history of BPH, kidney stone, HTN and HL who presents with progressively worsening shortness of breath and decrease exercise tolerance.  He also reports some chest discomfort that comes and goes.  Work up has demonstrated multi-vessel CAD with preserved EF and no valvular disease.  He is very functional and a good candidate for CABG.  I discussed the general nature of the procedure, including the need for general anesthesia, the incisions to be used, the use of cardiopulmonary bypass, and the use of temporary pacemaker wires and drainage tubes postoperatively with Mr. Nafziger and his wife.  We discussed the expected hospital stay, overall recovery and short and long term outcomes. I informed him of the indications, risks, benefits and alternatives.   He understands the risks  include, but are not limited to death, stroke, MI, DVT/PE, bleeding, possible need for transfusion, infections, cardiac arrhythmias, as well as other organ system dysfunction including respiratory (eg: prolonged ventilation), renal, or GI complications.   Patient is in agreement to proceed with surgery.  Plan: Proceed with CABG on 10/14.   I  spent 45 minutes counseling the patient face to face.  Con RAMAN Nusaiba Guallpa 06/18/2024 4:38 PM

## 2024-06-18 NOTE — Progress Notes (Unsigned)
 [address] [place] [address]             [phone]       HPI: Patient returns for routine postoperative follow-up having undergone CABG x4 on 06/02/24.  He was discharged on POD 6, he is now POD 16.  The patient's early postoperative recovery while in the hospital was notable for some bursts of afib/SVT as well as frequent PVCs so he was started on PO amio. The arrythmias resolved after adequate loading so amio was discontinued.  He also had an AKI that returned to baseline at the time of discharge.  Since hospital discharge the patient reports doing ok.  Last night he had a very unsettling evening and said his brain was going crazy.  He was unable to sleep and wonders if it was related to not taking gabapentin.  He felt like it got better after taking potassium and eating a banana.  He also feels weak, has a poor appetite but has been walking around his house.  He is taking tramadol PRN for pain.  His weight was 148 lbs at discharge and is 145 lbs today.  Allergies as of 06/18/2024       Reactions   Lipitor [atorvastatin  Calcium ] Other (See Comments)   Myalgia    Oxycodone-acetaminophen Nausea And Vomiting        Medication List        Accurate as of June 18, 2024  4:50 PM. If you have any questions, ask your nurse or doctor.          albuterol 108 (90 Base) MCG/ACT inhaler Commonly known as: VENTOLIN HFA Inhale 2 puffs into the lungs every 6 (six) hours as needed for wheezing or shortness of breath.   aspirin  EC 81 MG tablet Take 1 tablet (81 mg total) by mouth daily. Swallow whole.   ezetimibe 10 MG tablet Commonly known as: ZETIA Take 10 mg by mouth daily in the afternoon.   furosemide 20 MG tablet Commonly known as: LASIX Take 2 tablets (40 mg total) by mouth in the morning. For 5 days then decrease the dose to 1 tablet daily.   gabapentin 100 MG capsule Commonly known as: NEURONTIN Take 1 capsule (100 mg total) by mouth  2 (two) times daily.   Garlic 300 MG Caps Take 300 mg by mouth every evening.   hydrOXYzine 25 MG tablet Commonly known as: ATARAX Take 25 mg by mouth every other day as needed (sleep).   meclizine 25 MG tablet Commonly known as: ANTIVERT Take 25 mg by mouth 3 (three) times daily as needed for dizziness.   metoprolol  succinate 25 MG 24 hr tablet Commonly known as: Toprol  XL Take 1 tablet (25 mg total) by mouth daily.   minocycline 100 MG capsule Commonly known as: MINOCIN Take 100 mg by mouth daily as needed (skin blemishes).   multivitamin tablet Take 1 tablet by mouth in the morning.   potassium chloride SA 20 MEQ tablet Commonly known as: KLOR-CON M Take 1 tablet (20 mEq total) by mouth daily.   rosuvastatin  20 MG tablet Commonly known as: Crestor  Take 1 tablet (20 mg total) by mouth daily. What changed: when to take this   tamsulosin 0.4 MG Caps capsule Commonly known as: FLOMAX Take 0.4 mg by mouth every 3 (three) days. Patient takes this med at night   triamcinolone cream 0.1 % Commonly known as: KENALOG Apply 1 application  topically 2 (two) times daily as needed (skin rash/irritation.).   Vitamin D-3 125 MCG (5000 UT) Tabs Take 5,000 Units by mouth in the morning.        BP 139/82 (BP Location: Right Arm, Patient Position: Sitting, Cuff Size: Normal)   Pulse 91   Resp 20   Wt 145 lb 8 oz (66 kg)   SpO2 98% Comment: RA  BMI 24.21 kg/m   Physical Exam: General - Appears comfortable, no distress but still looks a little weak CV - RRR Resp - Unlabored on room air, diminished in the left base Abd - Soft, NT/ND Ext - Trace bilateral lower extremity edema    Imaging: CXR - Left pleural effusion. No pneumothorax.   Assessment/Plan:   Mr. Shear is a very pleasant 73 year old man s/p CABG x 4 on 06/02/24.  He is recovering ok.  Has had some insomnia, weakness, poor appetite, all of which can be expected at this time post-operatively.  I suspect  things will improve over the next couple weeks and encouraged him to stay positive, keep walking daily and attempt to improve PO intake.  His BMP today is excellent, K is 4.5 and Cre is back to normal.  He is almost of lasix and potassium pills which I will refill.  In regards to his pleural effusion, it is quite sizeable but he is completely asymptomatic.  Denies dyspnea, has no difficulty breathing while laying flat.  We discussed thoracentesis but both agreed to leave it alone for now.  He will return to clinic in 2 weeks with another CXR.  Con GORMAN Clunes, MD 4:50 PM 06/18/24

## 2024-06-19 LAB — BASIC METABOLIC PANEL WITH GFR
BUN/Creatinine Ratio: 15 (ref 10–24)
BUN: 17 mg/dL (ref 8–27)
CO2: 24 mmol/L (ref 20–29)
Calcium: 9.5 mg/dL (ref 8.6–10.2)
Chloride: 102 mmol/L (ref 96–106)
Creatinine, Ser: 1.13 mg/dL (ref 0.76–1.27)
Glucose: 114 mg/dL — ABNORMAL HIGH (ref 70–99)
Potassium: 4.5 mmol/L (ref 3.5–5.2)
Sodium: 141 mmol/L (ref 134–144)
eGFR: 69 mL/min/1.73 (ref >59)

## 2024-06-19 MED ORDER — ONDANSETRON HCL 4 MG PO TABS: 4.0000 mg | ORAL_TABLET | Freq: Three times a day (TID) | ORAL | 0 refills | Status: AC | PRN

## 2024-06-22 ENCOUNTER — Ambulatory Visit

## 2024-06-22 VITALS — BP 126/70 | HR 86 | Ht 65.0 in | Wt 142.2 lb

## 2024-06-22 DIAGNOSIS — E782 Mixed hyperlipidemia: Secondary | ICD-10-CM

## 2024-06-22 DIAGNOSIS — I10 Essential (primary) hypertension: Secondary | ICD-10-CM

## 2024-06-22 DIAGNOSIS — I2510 Atherosclerotic heart disease of native coronary artery without angina pectoris: Secondary | ICD-10-CM | POA: Diagnosis not present

## 2024-06-22 MED ORDER — POTASSIUM CHLORIDE CRYS ER 20 MEQ PO TBCR: 20.0000 meq | EXTENDED_RELEASE_TABLET | Freq: Every day | ORAL | 3 refills | Status: AC

## 2024-06-22 NOTE — Assessment & Plan Note (Signed)
 Able to take Crestor  20 mg once daily. Continue Zetia 10 mg once daily. Lipid panel 06/04/2024 total cholesterol 54, HDL 20, LDL 16, triglycerides 92 during his inpatient stay.  Follow-up at 26-month visit with a fasting lipid panel.

## 2024-06-22 NOTE — Assessment & Plan Note (Signed)
 Has mild chest discomfort since his surgery 3 weeks ago.  Gradually recovering.    Independent with his day-to-day activities. Has left pleural effusion which is being followed up by the surgeon and has a follow-up visit coming up in 2 weeks with repeat x-ray.  Recommended to continue with furosemide 20 mg once daily started by the surgeon. Blood work looks reassuring.  Oral intake is improved. Cut down potassium chloride supplementation to 1 times daily 20 mEq.  Continue aspirin  81 mg once daily. Lipid levels well-controlled.

## 2024-06-22 NOTE — Progress Notes (Signed)
 Cardiology Consultation:    Date:  06/22/2024   ID:  Dustin Sloan, DOB [birth year], MRN [id number]  PCP:  Duwaine Burnard Amble, NP  Cardiologist:  Alean SAUNDERS Vinessa Macconnell, MD   Referring MD: Duwaine Burnard Amble, NP   No chief complaint on file.    ASSESSMENT AND PLAN:   Mr Dustin Sloan 73 year old male history of CAD with progressive dyspnea on exertion s/p multivessel disease on cardiac cath 05/20/2024, s/p CABG 06/02/2024, hypertension, hyperlipidemia, statin intolerance (only tried lipitor), asymptomatic sinus bradycardia and prolonged PR interval, nephrolithiasis [identified incidentally on CT chest June 2025 at [facility] chronic symptoms of shortness of breath.  Non-smoker, does not drink alcohol or use any illicit drugs.  Brief postop A-fib/SVT episodes, was started on oral amiodarone for couple days and discontinued prior to discharge from the hospital as there was no significant ectopy.  2-week postop follow-up with surgeon noted left pleural effusion, decided to hold off on thoracentesis currently on low-dose furosemide and pending follow-up visit in 2 weeks.   Problem List Items Addressed This Visit     Hyperlipidemia   Able to take Crestor  20 mg once daily. Continue Zetia 10 mg once daily. Lipid panel 06/04/2024 total cholesterol 54, HDL 20, LDL 16, triglycerides 92 during his inpatient stay.  Follow-up at 24-month visit with a fasting lipid panel.      Essential hypertension - Primary   Well-controlled currently only on low-dose metoprolol  succinate 25 mg once daily. Continue to monitor. Target below 130/80 mmHg.      Relevant Orders   EKG 12-Lead (Completed)   CAD (coronary artery disease)   Has mild chest discomfort since his surgery 3 weeks ago.  Gradually recovering.    Independent with his day-to-day activities. Has left pleural effusion which is being followed up by the surgeon and has a follow-up visit coming up in 2 weeks with repeat x-ray.  Recommended to  continue with furosemide 20 mg once daily started by the surgeon. Blood work looks reassuring.  Oral intake is improved. Cut down potassium chloride supplementation to 1 times daily 20 mEq.  Continue aspirin  81 mg once daily. Lipid levels well-controlled.       Return to clinic tentatively in 6 months.  Follow-up with us  earlier on an as-needed basis.   History of Present Illness:    Dustin Sloan is a 73 y.o. male who is being seen today for follow-up visit. PCP Duwaine Burnard Amble, NP. Last visit with me in the office was 05/07/2024.  history of CAD with progressive dyspnea on exertion s/p multivessel disease on cardiac cath 05/20/2024, s/p CABG 06/02/2024, hypertension, hyperlipidemia, statin intolerance (only tried lipitor), asymptomatic sinus bradycardia and prolonged PR interval, nephrolithiasis [identified incidentally on CT chest June 2025 at [facility] chronic symptoms of shortness of breath.  Non-smoker, does not drink alcohol or use any illicit drugs.  Transthoracic echocardiogram 02/05/2024 at PCPs office reported LVEF 50 to 55%, no wall motion abnormality, grade 1 diastolic dysfunction, trace aortic insufficiency, mild MR and mild TR.   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  stress nuclear imaging July 2025 abnormal EKG portion and normal nuclear imaging portion of the study.   Procceded with Cardiac CT. Cardiac CT coronary angiogram 04/28/2024 noted severe coronary atherosclerosis calcium  score 3227, CAD RADS 4 disease in distal LAD and multiple moderate stenotic lesions.  CT FFR evaluation noted diffusely diseased arteries with decreased FFR in mid  to distal LAD, mid to distal diagonal.  Cardiac cath completed 05/20/2024 noted severe proximal LAD disease and large D1 disease, high-grade large OM 2 stenosis and RPDA stenosis.  Given multiple large vessel disease without good  targets for surgical revascularization referred for CABG.  Echocardiogram repeated 05/26/2024 noted normal LVEF 60 to 65%, no significant wall motion abnormalities.  No significant valve abnormalities.  Underwent CABG 06/02/2024 s/p LIMA-LAD, SVG-PDA, SVG-OM, SVG-diagonal by Dr Daniel Benders. IntraOp TEE with LVEF 52%, trace MR and TR. Postop course notable for short runs of A-fib and was started on oral amiodarone and this has been subsequently discontinued as his arrhythmias subsided without any significant number of amiodarone doses.  Discharged home on postop day 6.  Had follow-up visit with the surgeon 06/18/2024 and reviewed chest x-ray with left large left pleural effusion for now holding off on thoracentesis and plan for repeat clinic visit and chest x-ray in 2 weeks.  Was started on furosemide 20 mg once daily.  Here for the visit today by himself.  Mentions has been recovering well since surgery.  Does report significant chest discomfort that is constantly present since his surgery.  Gradually improving.  Also reported significant constipation over the last week with multiple days without bowel movement until finally resolving last night.  Denies any shortness of breath, orthopnea, paroxysmal nocturnal dyspnea.  No palpitations, lightheadedness, dizziness or syncopal episodes.  Denies any blood in urine or stools.  Blood work from 06/18/2024 BUN 17, creatinine 1.13, eGFR 69. Sodium 141 and potassium 4.5.  EKG in the clinic shows sinus rhythm, nonspecific ST-T changes.   Past Medical History:  Diagnosis Date   Abnormal stress test 04/15/2024   Abnormal urine 07/22/2023   Unspecified abnormal findings in urine     Allergies    Arthritis    Asymptomatic varicose veins of both lower extremities 08/23/2020   Benign prostatic hyperplasia    Blood in urine 08/08/2021   Hematuria, unspecifiedHematuria, unspecified; Start Date : 10/24/2022Hematuria, unspecified; Start Date : 03/14/2021      Body mass index (BMI) 24.0-24.9, adult 12/27/2021   Body mass index [BMI] 24.0-24.9, adultBody mass index [BMI] 24.0-24.9, adult; Start Date : 02/07/2023Body mass index [BMI] 24.0-24.9, adult; Start Date : 12/20/2022Body mass index [BMI] 24.0-24.9, adult; Start Date : 06/12/2021     Body mass index (BMI) 27.0-27.9, adult 02/24/2019   Body mass index [BMI] 27.0-27.9, adult     CAD (coronary artery disease) 05/07/2024   Capsulitis of metatarsophalangeal (MTP) joint of right foot 04/14/2020   Chest pain 08/23/2020   Disequilibrium 09/26/2021   Dizziness and giddinessDizziness and giddiness; Start Date : 04/22/2022Dizziness and giddiness; Start Date : 01/26/2022Dizziness and giddiness; Start Date : 12/17/2021Dizziness and giddiness; Start Date : 07/05/2020     Dizziness    Electrocardiogram abnormal 03/29/2022   Abnormal electrocardiogram [ECG] [EKG]     Encounter for examination of eyes and vision without abnormal findings 03/29/2022   Encounter for exam of eyes and vision w/o abnormal findingsEncounter for exam of eyes and vision w/o abnormal findings; Start Date : 07/26/2022Encounter for exam of eyes and vision w/o abnormal findings; Start Date : 04/29/2021Encounter for exam of eyes and vision w/o abnormal findings; Start Date : 02/24/2019     Encounter for screening for cardiovascular disorders 07/22/2023   Encounter for screening for cardiovascular disordersEncounter for screening for cardiovascular disorders; Start Date : 03/29/2022     Essential hypertension    Fatigue    History of kidney stones  Hyperlipidemia    Hypertension    Impaired cognition    Inflammatory dermatosis    Kidney stone    Low testosterone    Other specified health status 04/11/2023   Other specified health status     Pain in left foot 12/17/2019   Pain in left foot     Pain in right foot 12/17/2019   Pain in right foot     Pre-diabetes    Shortness of breath    Sleep apnea    does not use CPAP    Spasm 07/09/2022   Cramp and spasmCramp and spasm; Start Date : 02/24/2019     Testicular hypofunction    Tight heel cords, acquired, bilateral 04/15/2020   Unstable angina pectoris (HCC) 08/23/2020   Vitamin D deficiency     Past Surgical History:  Procedure Laterality Date   APPENDECTOMY     2018   CATARACT EXTRACTION W/ INTRAOCULAR LENS IMPLANT Right 06/18/2023   COLONOSCOPY     CORONARY ARTERY BYPASS GRAFT N/A 06/02/2024   Procedure: CORONARY ARTERY BYPASS GRAFTING X 4, USING LEFT INTERNAL MAMMARY ARTERY AND ENDOSCOPIC HARVESTED RIGHT AND LEFT GREATER SAPHENOUS VEIN;  Surgeon: Daniel Con RAMAN, MD;  Location: [facility] OR;  Service: Open Heart Surgery;  Laterality: N/A;   EYE SURGERY     bilateral cataracts removed   HERNIA REPAIR     INTRAOPERATIVE TRANSESOPHAGEAL ECHOCARDIOGRAM N/A 06/02/2024   Procedure: ECHOCARDIOGRAM, TRANSESOPHAGEAL, INTRAOPERATIVE;  Surgeon: Daniel Con RAMAN, MD;  Location: [facility] OR;  Service: Open Heart Surgery;  Laterality: N/A;   LITHOTRIPSY     x several   NECK SURGERY     1988, 2008   RIGHT/LEFT HEART CATH AND CORONARY ANGIOGRAPHY N/A 05/20/2024   Procedure: RIGHT/LEFT HEART CATH AND CORONARY ANGIOGRAPHY;  Surgeon: Ladona Heinz, MD;  Location: [facility] INVASIVE CV LAB;  Service: Cardiovascular;  Laterality: N/A;    Current Medications: Current Meds  Medication Sig   albuterol (VENTOLIN HFA) 108 (90 Base) MCG/ACT inhaler Inhale 2 puffs into the lungs every 6 (six) hours as needed for wheezing or shortness of breath.   aspirin  EC 81 MG tablet Take 1 tablet (81 mg total) by mouth daily. Swallow whole.   Cholecalciferol (VITAMIN D-3) 125 MCG (5000 UT) TABS Take 5,000 Units by mouth in the morning.   ezetimibe (ZETIA) 10 MG tablet Take 10 mg by mouth daily in the afternoon.   furosemide (LASIX) 20 MG tablet Take 1 tablet (20 mg total) by mouth daily.   gabapentin (NEURONTIN) 100 MG capsule Take 1 capsule (100 mg total) by mouth 2 (two) times daily.   Garlic 300 MG CAPS  Take 300 mg by mouth every evening.   hydrOXYzine (ATARAX) 25 MG tablet Take 25 mg by mouth every other day as needed (sleep).   meclizine (ANTIVERT) 25 MG tablet Take 25 mg by mouth 3 (three) times daily as needed for dizziness.   metoprolol  succinate (TOPROL  XL) 25 MG 24 hr tablet Take 1 tablet (25 mg total) by mouth daily.   minocycline (MINOCIN) 100 MG capsule Take 100 mg by mouth daily as needed (skin blemishes).   Multiple Vitamin (MULTIVITAMIN) tablet Take 1 tablet by mouth in the morning.   ondansetron (ZOFRAN) 4 MG tablet Take 1 tablet (4 mg total) by mouth every 8 (eight) hours as needed for nausea or vomiting.   potassium chloride SA (KLOR-CON M) 20 MEQ tablet Take 1 tablet (20 mEq total) by mouth daily.   rosuvastatin  (CRESTOR ) 20 MG tablet  Take 1 tablet (20 mg total) by mouth daily. (Patient taking differently: Take 20 mg by mouth at bedtime.)   tamsulosin (FLOMAX) 0.4 MG CAPS capsule Take 0.4 mg by mouth every 3 (three) days. Patient takes this med at night   triamcinolone (KENALOG) 0.1 % Apply 1 application  topically 2 (two) times daily as needed (skin rash/irritation.).   [DISCONTINUED] potassium chloride SA (KLOR-CON M) 20 MEQ tablet Take 1 tablet (20 mEq total) by mouth 2 (two) times daily.     Allergies:   Lipitor [atorvastatin  calcium ] and Oxycodone-acetaminophen   Social History   Socioeconomic History   Marital status: Married    Spouse name: Not on file   Number of children: 4   Years of education: Not on file   Highest education level: Not on file  Occupational History   Not on file  Tobacco Use   Smoking status: Never   Smokeless tobacco: Never  Vaping Use   Vaping status: Never Used  Substance and Sexual Activity   Alcohol use: Never   Drug use: Never   Sexual activity: Not Currently  Other Topics Concern   Not on file  Social History Narrative   Not on file   Social Drivers of Health   Financial Resource Strain: Not on file  Food Insecurity: No  Food Insecurity (06/03/2024)   Hunger Vital Sign    Worried About Running Out of Food in the Last Year: Never true    Ran Out of Food in the Last Year: Never true  Transportation Needs: No Transportation Needs (06/03/2024)   PRAPARE - Administrator, Civil Service (Medical): No    Lack of Transportation (Non-Medical): No  Physical Activity: Not on file  Stress: Not on file  Social Connections: Socially Integrated (06/03/2024)   Social Connection and Isolation Panel    Frequency of Communication with Friends and Family: Twice a week    Frequency of Social Gatherings with Friends and Family: Twice a week    Attends Religious Services: More than 4 times per year    Active Member of [employer] or Organizations: Yes    Attends Engineer, Structural: More than 4 times per year    Marital Status: Married     Family History: The patient's family history includes Diabetes in his mother; Kidney Stones in his mother and sister; Kidney disease in his mother. ROS:   Please see the history of present illness.    All 14 point review of systems negative except as described per history of present illness.  EKGs/Labs/Other Studies Reviewed:    The following studies were reviewed today:   EKG:  EKG Interpretation Date/Time:  Monday June 22 2024 09:16:53 EST Ventricular Rate:  83 PR Interval:  238 QRS Duration:  96 QT Interval:  378 QTC Calculation: 445 R Axis:   66  Text Interpretation: Sinus rhythm with 1st degree A-V block nonspecific t wave changes Abnormal ECG When compared with ECG of 03-Jun-2024 06:45, Minimal criteria for Inferior infarct are no longer Present ST no longer elevated in Lateral leads T wave inversion now evident in Anterolateral leads Confirmed by Liborio Hai reddy [phone]) on 06/22/2024 9:25:50 AM    Recent Labs: 02/14/2024: NT-Pro BNP 41 06/01/2024: ALT 26 06/05/2024: Magnesium 2.2 06/08/2024: Hemoglobin 7.9; Platelets 123 06/18/2024: BUN 17;  Creatinine, Ser 1.13; Potassium 4.5; Sodium 141  Recent Lipid Panel    Component Value Date/Time   CHOL 54 06/04/2024 0429   CHOL 198 08/23/2020 1039  TRIG 92 06/04/2024 0429   HDL 20 (L) 06/04/2024 0429   HDL 34 (L) 08/23/2020 1039   CHOLHDL 2.7 06/04/2024 0429   VLDL 18 06/04/2024 0429   LDLCALC 16 06/04/2024 0429   LDLCALC 130 (H) 08/23/2020 1039    Physical Exam:    VS:  BP 126/70   Pulse 86   Ht 5' 5 (1.651 m)   Wt 142 lb 3.2 oz (64.5 kg)   SpO2 97%   BMI 23.66 kg/m     Wt Readings from Last 3 Encounters:  06/22/24 142 lb 3.2 oz (64.5 kg)  06/18/24 145 lb 8 oz (66 kg)  06/08/24 148 lb 9.4 oz (67.4 kg)     GENERAL:  Well nourished, well developed in no acute distress NECK: No JVD; No carotid bruits CARDIAC: Sternotomy site appears to be healing well.  RRR, S1 and S2 present, no murmurs, no rubs, no gallops CHEST:  Clear to auscultation, mildly reduced breath sounds left lower lung fields. Extremities: No pitting pedal edema. Pulses bilaterally symmetric with radial 2+ and dorsalis pedis 2+ NEUROLOGIC:  Alert and oriented x 3  Medication Adjustments/Labs and Tests Ordered: Current medicines are reviewed at length with the patient today.  Concerns regarding medicines are outlined above.  Orders Placed This Encounter  Procedures   EKG 12-Lead   Meds ordered this encounter  Medications   potassium chloride SA (KLOR-CON M) 20 MEQ tablet    Sig: Take 1 tablet (20 mEq total) by mouth daily.    Dispense:  90 tablet    Refill:  3    Signed, Dayyan Krist reddy Jola Critzer, MD, MPH, [facility]. 06/22/2024 9:32 AM    [facility]

## 2024-06-22 NOTE — Patient Instructions (Signed)
 Medication Instructions:  Your physician has recommended you make the following change in your medication:   START: Potasium chloride 20 meq daily  *If you need a refill on your cardiac medications before your next appointment, please call your pharmacy*  Lab Work: None If you have labs (blood work) drawn today and your tests are completely normal, you will receive your results only by: MyChart Message (if you have MyChart) OR A paper copy in the mail If you have any lab test that is abnormal or we need to change your treatment, we will call you to review the results.  Testing/Procedures: None  Follow-Up: At [facility], you and your health needs are our priority.  As part of our continuing mission to provide you with exceptional heart care, our providers are all part of one team.  This team includes your primary Cardiologist (physician) and Advanced Practice Providers or APPs (Physician Assistants and Nurse Practitioners) who all work together to provide you with the care you need, when you need it.  Your next appointment:   6 month(s)  Provider:   Alean Kobus, MD    We recommend signing up for the patient portal called MyChart.  Sign up information is provided on this After Visit Summary.  MyChart is used to connect with patients for Virtual Visits (Telemedicine).  Patients are able to view lab/test results, encounter notes, upcoming appointments, etc.  Non-urgent messages can be sent to your provider as well.   To learn more about what you can do with MyChart, go to [email or web address].   Other Instructions None

## 2024-06-22 NOTE — Assessment & Plan Note (Signed)
 Well-controlled currently only on low-dose metoprolol  succinate 25 mg once daily. Continue to monitor. Target below 130/80 mmHg.

## 2024-07-02 ENCOUNTER — Other Ambulatory Visit (HOSPITAL_COMMUNITY): Payer: Self-pay

## 2024-07-02 ENCOUNTER — Ambulatory Visit

## 2024-07-06 ENCOUNTER — Other Ambulatory Visit (HOSPITAL_COMMUNITY): Payer: Self-pay

## 2024-07-09 ENCOUNTER — Ambulatory Visit: Payer: Self-pay

## 2024-07-09 ENCOUNTER — Ambulatory Visit (HOSPITAL_COMMUNITY)
Admission: RE | Admit: 2024-07-09 | Discharge: 2024-07-09 | Disposition: A | Discharge status: Home / Self Care | Source: Ambulatory Visit | Attending: Student in an Organized Health Care Education/Training Program | Admitting: Student in an Organized Health Care Education/Training Program

## 2024-07-09 VITALS — BP 150/96 | HR 90 | Resp 20 | Ht 65.0 in | Wt 146.0 lb

## 2024-07-09 DIAGNOSIS — R918 Other nonspecific abnormal finding of lung field: Secondary | ICD-10-CM | POA: Diagnosis not present

## 2024-07-09 DIAGNOSIS — Z981 Arthrodesis status: Secondary | ICD-10-CM | POA: Diagnosis not present

## 2024-07-09 DIAGNOSIS — J90 Pleural effusion, not elsewhere classified: Secondary | ICD-10-CM | POA: Diagnosis not present

## 2024-07-09 DIAGNOSIS — Z951 Presence of aortocoronary bypass graft: Secondary | ICD-10-CM

## 2024-07-09 DIAGNOSIS — I25118 Atherosclerotic heart disease of native coronary artery with other forms of angina pectoris: Secondary | ICD-10-CM | POA: Insufficient documentation

## 2024-07-09 MED ORDER — GABAPENTIN 100 MG PO CAPS: 100.0000 mg | ORAL_CAPSULE | Freq: Two times a day (BID) | ORAL | 1 refills | Status: AC

## 2024-07-09 NOTE — Progress Notes (Signed)
 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  He was discharged on POD 6, he is now POD 16.  At his last clinic visit, he reported an episode of his brain going crazy and issues with insomnia.  He has not had further episodes like the previous but he does still have insomnia.  He also reports depression and will start crying just listening to music sometimes.  Otherwise his weight has been stable and his BP at home as been 120s/80s.  His BP is elevated today because he took the stairs from the 1st to the 4th floor.  He is eating ok and having regular bowel movements.  Allergies as of 07/09/2024       Reactions   Lipitor [atorvastatin  Calcium ] Other (See Comments)   Myalgia    Oxycodone-acetaminophen  Nausea And Vomiting        Medication List        Accurate as of July 09, 2024 11:59 PM. If you have any questions, ask your nurse or doctor.          albuterol  108 (90 Base) MCG/ACT inhaler Commonly known as: VENTOLIN  HFA Inhale 2 puffs into the lungs every 6 (six) hours as needed for wheezing or shortness of breath.   aspirin  EC 81 MG tablet Take 1 tablet (81 mg total) by mouth daily. Swallow whole.   ezetimibe  10 MG tablet Commonly known as: ZETIA  Take 10 mg by mouth daily in the afternoon.   furosemide  20 MG tablet Commonly known as: Lasix  Take 1 tablet (20 mg total) by mouth daily.   gabapentin  100 MG capsule Commonly known as: NEURONTIN  Take 1 capsule (100 mg total) by mouth 2 (two) times daily.   Garlic 300 MG Caps Take 300 mg by mouth every evening.   hydrOXYzine  25 MG tablet Commonly known as: ATARAX  Take 25 mg by mouth every other day as needed (sleep).   meclizine 25 MG tablet Commonly known as: ANTIVERT Take 25 mg by mouth 3 (three) times daily as needed for dizziness.   metoprolol  succinate 25 MG 24  hr tablet Commonly known as: Toprol  XL Take 1 tablet (25 mg total) by mouth daily.   minocycline 100 MG capsule Commonly known as: MINOCIN Take 100 mg by mouth daily as needed (skin blemishes).   multivitamin tablet Take 1 tablet by mouth in the morning.   ondansetron  4 MG tablet Commonly known as: ZOFRAN  Take 1 tablet (4 mg total) by mouth every 8 (eight) hours as needed for nausea or vomiting.   potassium chloride  SA 20 MEQ tablet Commonly known as: KLOR-CON  M Take 1 tablet (20 mEq total) by mouth daily.   rosuvastatin  20 MG tablet Commonly known as: Crestor  Take 1 tablet (20 mg total) by mouth daily. What changed: when to take this   tamsulosin  0.4 MG Caps capsule Commonly known as: FLOMAX  Take 0.4 mg by mouth every 3 (three) days. Patient takes this med at night   triamcinolone cream 0.1 % Commonly known as: KENALOG Apply 1 application  topically 2 (two) times daily as needed (skin rash/irritation.).   Vitamin D-3 125 MCG (5000 UT) Tabs Take 5,000 Units by mouth in the morning.        BP (!) 150/96   Pulse  90   Resp 20   Ht 5' 5 (1.651 m)   Wt 146 lb (66.2 kg)   SpO2 97%   BMI 24.30 kg/m   Physical Exam: General - Appears comfortable, no distress but still looks a little weak CV - RRR Resp - Unlabored on room air, diminished in the left base Abd - Soft, NT/ND Ext - Trace bilateral lower extremity edema    Imaging: CXR - Slightly improved left pleural effusion.   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  He continues to progress in his recovery - currently mainly reports insomnia and sadness.  I explained it is somewhat common for people to feel depressed after having a major life altering event like heart surgery.  I also think it may be the metoprolol  that can cause some depression.  His BP at home is good so I didn't want to adjust his medications in clinic, but suggested he bring it up with his  PCP.  His left pleural effusion is still present but a little bit better.  He is still asymptomatic so will hold off on intervention for now and continue lasix .  He is now cleared to drive and slowly increase his lifting.  I will also refer him to cardiac rehab.  He was very excited to be able to drive again so I hope this will help his mood.  He is to return to my clinic as needed and should continue following up with his PCP and cardiologist lifelong.  Con GORMAN Clunes, MD 4:43 PM 07/12/24

## 2024-07-10 ENCOUNTER — Telehealth: Payer: Self-pay | Admitting: *Deleted

## 2024-07-10 NOTE — Telephone Encounter (Signed)
 Per Dr. Daniel, referral for cardiac rehab faxed to [facility].

## 2024-07-29 DIAGNOSIS — Z1389 Encounter for screening for other disorder: Secondary | ICD-10-CM | POA: Diagnosis not present

## 2024-07-29 DIAGNOSIS — Z1321 Encounter for screening for nutritional disorder: Secondary | ICD-10-CM | POA: Diagnosis not present

## 2024-07-29 DIAGNOSIS — I25118 Atherosclerotic heart disease of native coronary artery with other forms of angina pectoris: Secondary | ICD-10-CM | POA: Diagnosis not present

## 2024-07-29 DIAGNOSIS — E559 Vitamin D deficiency, unspecified: Secondary | ICD-10-CM | POA: Diagnosis not present

## 2024-08-31 ENCOUNTER — Other Ambulatory Visit: Payer: Self-pay

## 2024-08-31 ENCOUNTER — Telehealth: Payer: Self-pay

## 2024-08-31 NOTE — Telephone Encounter (Signed)
 Called Dustin Sloan at the [facility] and reviewed the patient's medications with her and faxed the patient's last office note to [facility]. Dustin Sloan had no further questions at this time.

## 2024-08-31 NOTE — Telephone Encounter (Signed)
" °  Madison from [employer] calling to request last visit notes fax to them at [phone]. They also would like to verify patient's medications  "

## 2024-09-15 ENCOUNTER — Other Ambulatory Visit: Payer: Self-pay

## 2024-09-15 MED ORDER — ROSUVASTATIN CALCIUM 20 MG PO TABS: 20.0000 mg | ORAL_TABLET | Freq: Every day | ORAL | 2 refills | Status: DC

## 2024-09-16 ENCOUNTER — Other Ambulatory Visit: Payer: Self-pay

## 2024-09-22 MED ORDER — ROSUVASTATIN CALCIUM 20 MG PO TABS: 20.0000 mg | ORAL_TABLET | Freq: Every day | ORAL | 2 refills | Status: AC
